# Patient Record
Sex: Female | Born: 1970 | Race: White | Hispanic: No | Marital: Married | Smoking: Never smoker
Health system: Southern US, Community
[De-identification: ages and names within clinical notes are randomized; demographics above are authoritative.]

## PROBLEM LIST (undated history)

## (undated) DIAGNOSIS — I351 Nonrheumatic aortic (valve) insufficiency: Secondary | ICD-10-CM

## (undated) DIAGNOSIS — D649 Anemia, unspecified: Secondary | ICD-10-CM

## (undated) DIAGNOSIS — K645 Perianal venous thrombosis: Secondary | ICD-10-CM

## (undated) DIAGNOSIS — N879 Dysplasia of cervix uteri, unspecified: Secondary | ICD-10-CM

## (undated) DIAGNOSIS — R011 Cardiac murmur, unspecified: Secondary | ICD-10-CM

## (undated) HISTORY — PX: OTHER SURGICAL HISTORY: SHX169

## (undated) HISTORY — PX: ABDOMINAL HYSTERECTOMY: SHX81

## (undated) HISTORY — PX: SINUS EXPLORATION: SHX5214

---

## 2005-10-08 ENCOUNTER — Ambulatory Visit: Payer: Self-pay

## 2007-11-02 ENCOUNTER — Observation Stay: Payer: Self-pay | Admitting: Certified Nurse Midwife

## 2008-01-05 HISTORY — PX: OTHER SURGICAL HISTORY: SHX169

## 2008-03-22 ENCOUNTER — Inpatient Hospital Stay: Payer: Self-pay

## 2008-03-29 ENCOUNTER — Ambulatory Visit: Payer: Self-pay | Admitting: Pediatrics

## 2008-05-11 DIAGNOSIS — D239 Other benign neoplasm of skin, unspecified: Secondary | ICD-10-CM

## 2008-05-11 HISTORY — DX: Other benign neoplasm of skin, unspecified: D23.9

## 2010-08-14 HISTORY — PX: LEEP: SHX91

## 2011-08-29 ENCOUNTER — Ambulatory Visit: Payer: Self-pay | Admitting: Obstetrics and Gynecology

## 2014-03-24 ENCOUNTER — Ambulatory Visit: Payer: Self-pay | Admitting: General Practice

## 2014-07-14 ENCOUNTER — Ambulatory Visit: Payer: Self-pay | Admitting: Obstetrics and Gynecology

## 2015-03-28 ENCOUNTER — Emergency Department
Admission: EM | Admit: 2015-03-28 | Discharge: 2015-03-28 | Disposition: A | Discharge status: Home / Self Care | Payer: 59 | Attending: Emergency Medicine | Admitting: Emergency Medicine

## 2015-03-28 ENCOUNTER — Emergency Department: Payer: 59

## 2015-03-28 ENCOUNTER — Encounter: Payer: Self-pay | Admitting: Emergency Medicine

## 2015-03-28 DIAGNOSIS — Z79899 Other long term (current) drug therapy: Secondary | ICD-10-CM | POA: Insufficient documentation

## 2015-03-28 DIAGNOSIS — R109 Unspecified abdominal pain: Secondary | ICD-10-CM

## 2015-03-28 DIAGNOSIS — Z3202 Encounter for pregnancy test, result negative: Secondary | ICD-10-CM | POA: Diagnosis not present

## 2015-03-28 DIAGNOSIS — Z7951 Long term (current) use of inhaled steroids: Secondary | ICD-10-CM | POA: Diagnosis not present

## 2015-03-28 DIAGNOSIS — M545 Low back pain: Secondary | ICD-10-CM | POA: Diagnosis not present

## 2015-03-28 DIAGNOSIS — R1031 Right lower quadrant pain: Secondary | ICD-10-CM | POA: Diagnosis present

## 2015-03-28 HISTORY — DX: Cardiac murmur, unspecified: R01.1

## 2015-03-28 LAB — WET PREP, GENITAL
CLUE CELLS WET PREP: NONE SEEN
Trich, Wet Prep: NONE SEEN
Yeast Wet Prep HPF POC: NONE SEEN

## 2015-03-28 LAB — BASIC METABOLIC PANEL
Anion gap: 8 (ref 5–15)
BUN: 11 mg/dL (ref 6–20)
CO2: 24 mmol/L (ref 22–32)
Calcium: 8.6 mg/dL — ABNORMAL LOW (ref 8.9–10.3)
Chloride: 108 mmol/L (ref 101–111)
Creatinine, Ser: 0.71 mg/dL (ref 0.44–1.00)
GFR calc non Af Amer: >60 mL/min (ref >60)
GLUCOSE: 98 mg/dL (ref 65–99)
POTASSIUM: 3.2 mmol/L — AB (ref 3.5–5.1)
SODIUM: 140 mmol/L (ref 135–145)

## 2015-03-28 LAB — CBC
HEMATOCRIT: 35.7 % (ref 35.0–47.0)
Hemoglobin: 11.6 g/dL — ABNORMAL LOW (ref 12.0–16.0)
MCH: 25.3 pg — ABNORMAL LOW (ref 26.0–34.0)
MCHC: 32.4 g/dL (ref 32.0–36.0)
MCV: 78.2 fL — AB (ref 80.0–100.0)
PLATELETS: 324 10*3/uL (ref 150–440)
RBC: 4.57 MIL/uL (ref 3.80–5.20)
RDW: 14.5 % (ref 11.5–14.5)
WBC: 11.3 10*3/uL — AB (ref 3.6–11.0)

## 2015-03-28 LAB — URINALYSIS COMPLETE WITH MICROSCOPIC (ARMC ONLY)
Bilirubin Urine: NEGATIVE
Glucose, UA: NEGATIVE mg/dL
Hgb urine dipstick: NEGATIVE
Ketones, ur: NEGATIVE mg/dL
NITRITE: NEGATIVE
Protein, ur: NEGATIVE mg/dL
Specific Gravity, Urine: 1.008 (ref 1.005–1.030)
pH: 9 — ABNORMAL HIGH (ref 5.0–8.0)

## 2015-03-28 LAB — CHLAMYDIA/NGC RT PCR (ARMC ONLY)
Chlamydia Tr: NOT DETECTED
N gonorrhoeae: NOT DETECTED

## 2015-03-28 LAB — PREGNANCY, URINE: Preg Test, Ur: NEGATIVE

## 2015-03-28 LAB — POCT PREGNANCY, URINE: Preg Test, Ur: NEGATIVE

## 2015-03-28 MED ORDER — SODIUM CHLORIDE 0.9 % IV BOLUS (SEPSIS)
1000.0000 mL | Freq: Once | INTRAVENOUS | Status: AC
  Administered 2015-03-28: 1000 mL via INTRAVENOUS

## 2015-03-28 MED ORDER — MORPHINE SULFATE 4 MG/ML IJ SOLN
4.0000 mg | Freq: Once | INTRAMUSCULAR | Status: AC
  Administered 2015-03-28: 4 mg via INTRAVENOUS

## 2015-03-28 MED ORDER — OXYCODONE-ACETAMINOPHEN 5-325 MG PO TABS: 1.0000 | ORAL_TABLET | Freq: Four times a day (QID) | ORAL | Status: DC | PRN

## 2015-03-28 MED ORDER — ONDANSETRON HCL 4 MG/2ML IJ SOLN
4.0000 mg | Freq: Once | INTRAMUSCULAR | Status: AC
Start: 2015-03-28 — End: 2015-03-28
  Administered 2015-03-28: 4 mg via INTRAVENOUS

## 2015-03-28 MED ORDER — MORPHINE SULFATE 4 MG/ML IJ SOLN
INTRAMUSCULAR | Status: AC
  Administered 2015-03-28: 4 mg via INTRAVENOUS
  Filled 2015-03-28: qty 1

## 2015-03-28 MED ORDER — ONDANSETRON HCL 4 MG/2ML IJ SOLN
INTRAMUSCULAR | Status: AC
  Administered 2015-03-28: 4 mg via INTRAVENOUS
  Filled 2015-03-28: qty 2

## 2015-03-28 NOTE — ED Notes (Signed)
Patient ambulatory to triage with steady gait, without difficulty, appears uncomfortable, tearful; pt reports since 530pm having right side/flank pain radiating into right lower abd (took 2 advil 1130pm) accomp by nausea; denies hx of same

## 2015-03-28 NOTE — ED Provider Notes (Signed)
[facility] Emergency Department Provider Note  ____________________________________________  Time seen: Approximately 341 AM  I have reviewed the triage vital signs and the nursing notes.   HISTORY  Chief Complaint Flank Pain    HPI Mio Schellinger is a 44 y.o. female who comes in today with right sided lower back pain that started at 1730. The patient reports that it worsened and wrapped around to her right lower quadrant. The patient reports that she's tried ice heat and Advil but the pain never improved. She reports that the pain simply worsened and worsened until she decided to come in for evaluation. The patient reports that she's never had this pain before and became nauseous when she arrived to the hospital. The patient reports currently after some medicine her pain as a 4 out of 10 in intensity. The patient also reports that she feels gassy currently. She denies pain with urination or hematuria. She is unsure what causing the pain but reports that she is unable to tolerate it anymore.   Past Medical History  Diagnosis Date  . Heart murmur     There are no active problems to display for this patient.   History reviewed. No pertinent past surgical history.  Current Outpatient Rx  Name  Route  Sig  Dispense  Refill  . cetirizine (ZYRTEC) 10 MG tablet   Oral   Take 10 mg by mouth daily.         Marland Kitchen ibuprofen (ADVIL,MOTRIN) 200 MG tablet   Oral   Take 200 mg by mouth every 6 (six) hours as needed.         . montelukast (SINGULAIR) 10 MG tablet   Oral   Take 10 mg by mouth at bedtime.         . Beclomethasone Dipropionate (QNASL CHILDRENS) 40 MCG/ACT AERS   Each Nare   Place 2 sprays into both nostrils 2 (two) times daily.         Marland Kitchen oxyCODONE-acetaminophen (ROXICET) 5-325 MG per tablet   Oral   Take 1 tablet by mouth every 6 (six) hours as needed.   12 tablet   0     Allergies Sulfa antibiotics  No family history on  file.  Social History History  Substance Use Topics  . Smoking status: Never Smoker   . Smokeless tobacco: Not on file  . Alcohol Use: No    Review of Systems Constitutional: No fever/chills Eyes: No visual changes. ENT: No sore throat. Cardiovascular: Denies chest pain. Respiratory: Denies shortness of breath. Gastrointestinal: Abdominal pain Genitourinary: Negative for dysuria. Musculoskeletal: right sided back pain. Skin: Negative for rash. Neurological: Negative for headaches, focal weakness or numbness.  10-point ROS otherwise negative.  ____________________________________________   PHYSICAL EXAM:  VITAL SIGNS: ED Triage Vitals  Enc Vitals Group     BP 03/28/15 0248 126/75 mmHg     Pulse Rate 03/28/15 0248 93     Resp 03/28/15 0248 22     Temp 03/28/15 0248 98 F (36.7 C)     Temp Source 03/28/15 0248 Oral     SpO2 03/28/15 0248 100 %     Weight 03/28/15 0248 162 lb (73.483 kg)     Height 03/28/15 0248 5\' 4"  (1.626 m)     Head Cir --      Peak Flow --      Pain Score 03/28/15 0249 8     Pain Loc --      Pain Edu? --  Excl. in [place]? --     Constitutional: Alert and oriented. Well appearing and in moderate distress. Eyes: Conjunctivae are normal. PERRL. EOMI. Head: Atraumatic. Nose: No congestion/rhinnorhea. Mouth/Throat: Mucous membranes are moist.  Oropharynx non-erythematous. Cardiovascular: Normal rate, regular rhythm. Grossly normal heart sounds.   Respiratory: Normal respiratory effort.  No retractions. Lungs CTAB. Gastrointestinal: Soft right lower quadrant tenderness to palpation, mild right CVA tenderness Genitourinary: deferred Musculoskeletal: No lower extremity tenderness nor edema.  No joint effusions. Neurologic:  Normal speech and language. No gross focal neurologic deficits are appreciated.  Skin:  Skin is warm, dry and intact. No rash noted. Psychiatric: Mood and affect are normal.   ____________________________________________    LABS (all labs ordered are listed, but only abnormal results are displayed)  Labs Reviewed  BASIC METABOLIC PANEL - Abnormal; Notable for the following:    Potassium 3.2 (*)    Calcium 8.6 (*)    All other components within normal limits  CBC - Abnormal; Notable for the following:    WBC 11.3 (*)    Hemoglobin 11.6 (*)    MCV 78.2 (*)    MCH 25.3 (*)    All other components within normal limits  URINALYSIS COMPLETEWITH MICROSCOPIC ([facility] ONLY) - Abnormal; Notable for the following:    Color, Urine STRAW (*)    APPearance CLEAR (*)    pH 9.0 (*)    Leukocytes, UA TRACE (*)    Bacteria, UA RARE (*)    Squamous Epithelial / LPF 0-5 (*)    All other components within normal limits  WET PREP, GENITAL  CHLAMYDIA/NGC RT PCR ([facility] ONLY)  PREGNANCY, URINE  POCT PREGNANCY, URINE   ____________________________________________  EKG  none ____________________________________________  RADIOLOGY  CT Renal Stone protocol: No acute findings are evident in the abdomen or pelvis, negative for urinary calculus, hydronephrosis or ureteral dilation, normal appendix, small fat-containing umbilical hernia ____________________________________________   PROCEDURES  Procedure(s) performed: None  Critical Care performed: No  ____________________________________________   INITIAL IMPRESSION / ASSESSMENT AND PLAN / ED COURSE  Pertinent labs & imaging results that were available during my care of the patient were reviewed by me and considered in my medical decision making (see chart for details).  This is a 44 year old female who comes in with acute onset of right flank pain with radiation to her right lower quadrant. The patient received some morphine and zofran for pain as well as some fluids. We will await the results of the patient's CT scan and reasses the patient  ----------------------------------------- 7:45 AM on  03/28/2015 -----------------------------------------  The patient's pain is improved but at this time we are unsure what is causing the patient's pain. I did perform a pelvic ultrasound and the patient did not have any adnexal masses or tenderness palpation. We will await the results of the patient's wet prep and disposition the patient at that point. I will give the patient some medicine for home and have her follow-up with her doctors. The patient's care was signed out to Dr. Thomasene Lot will follow-up the results of the wet prep. ____________________________________________   FINAL CLINICAL IMPRESSION(S) / ED DIAGNOSES  Final diagnoses:  Right flank pain  Right lower quadrant abdominal pain      Loney Hering, MD 03/28/15 [phone]

## 2015-03-28 NOTE — ED Notes (Signed)
MD at bedside. 

## 2015-03-28 NOTE — ED Notes (Signed)
Patient transported to CT 

## 2015-03-28 NOTE — ED Notes (Signed)
Patient present to ED with complaint of right flank pain radiating to RLQ abdominal pain since 530 pm last evening. Patient reports feeling nauseas, bloating, and "burping a lot." Patient denies any urinary problems, diarrhea, chest pain, shortness of breath, or fevers at home. Patient appears in discomfort, unable to get into comfortable position. Patient alert and oriented, respirations even and unlabored. Family at bedside, call bell within reach.

## 2015-03-28 NOTE — ED Provider Notes (Signed)
-----------------------------------------   8:41 AM on 03/28/2015 -----------------------------------------  I have just seen this patient, interviewed her and reexamined her, after the patient had been signed out to me by Dr. Dahlia Client. See Dr. Pollyann Samples history and physical for the detail of this patient's care through the past night in the emergency department.  In summary, the patient came in with severe right flank pain that shifted towards her right lower quadrant. Apparently she had the appearance of renal colic. She had a negative CT scan of her abdomen and pelvis and normal urine on exam.  Dr. Dahlia Client had performed a pelvic exam which was benign. A wet prep was pending.  Wet prep has returned with white blood cells but no clue cells.  On reassessment, the patient appears quite comfortable. She says she feels fine. She is not having any pain.   We have discussed the wet prep findings. She has not had any vaginal discharge or other vaginal complaint. We have agreed not to use any medication due to the limited finding of white blood cells on the wet prep.  Dr. Dahlia Client has prescribed Roxicet, dispensed 12, in case her pain returns. Laura Schmitt and I have spoken about the possibility of pain returning and take the Roxicet as well as ibuprofen, but if pain is not controlled, to return to the emergency department.  Ahmed Prima, MD 03/28/15 [phone]

## 2015-03-28 NOTE — Discharge Instructions (Signed)
Flank Pain Flank pain refers to pain that is located on the side of the body between the upper abdomen and the back. The pain may occur over a short period of time (acute) or may be long-term or reoccurring (chronic). It may be mild or severe. Flank pain can be caused by many things. CAUSES  Some of the more common causes of flank pain include:  Muscle strains.   Muscle spasms.   A disease of your spine (vertebral disk disease).   A lung infection (pneumonia).   Fluid around your lungs (pulmonary edema).   A kidney infection.   Kidney stones.   A very painful skin rash caused by the chickenpox virus (shingles).   Gallbladder disease.  [place] care will depend on the cause of your pain. In general,  Rest as directed by your caregiver.  Drink enough fluids to keep your urine clear or pale yellow.  Only take over-the-counter or prescription medicines as directed by your caregiver. Some medicines may help relieve the pain.  Tell your caregiver about any changes in your pain.  Follow up with your caregiver as directed. SEEK IMMEDIATE MEDICAL CARE IF:   Your pain is not controlled with medicine.   You have new or worsening symptoms.  Your pain increases.   You have abdominal pain.   You have shortness of breath.   You have persistent nausea or vomiting.   You have swelling in your abdomen.   You feel faint or pass out.   You have blood in your urine.  You have a fever or persistent symptoms for more than 2-3 days.  You have a fever and your symptoms suddenly get worse. MAKE SURE YOU:   Understand these instructions.  Will watch your condition.  Will get help right away if you are not doing well or get worse. Document Released: 11/07/2005 Document Revised: 06/10/2012 Document Reviewed: 04/30/2012 [facility] Patient Information 2015 [place], [place]. This information is not intended to replace advice given to you by your  health care provider. Make sure you discuss any questions you have with your health care provider.  Abdominal Pain, Women Abdominal (stomach, pelvic, or belly) pain can be caused by many things. It is important to tell your doctor:  The location of the pain.  Does it come and go or is it present all the time?  Are there things that start the pain (eating certain foods, exercise)?  Are there other symptoms associated with the pain (fever, nausea, vomiting, diarrhea)? All of this is helpful to know when trying to find the cause of the pain. CAUSES   Stomach: virus or bacteria infection, or ulcer.  Intestine: appendicitis (inflamed appendix), regional ileitis (Crohn's disease), ulcerative colitis (inflamed colon), irritable bowel syndrome, diverticulitis (inflamed diverticulum of the colon), or cancer of the stomach or intestine.  Gallbladder disease or stones in the gallbladder.  Kidney disease, kidney stones, or infection.  Pancreas infection or cancer.  Fibromyalgia (pain disorder).  Diseases of the female organs:  Uterus: fibroid (non-cancerous) tumors or infection.  Fallopian tubes: infection or tubal pregnancy.  Ovary: cysts or tumors.  Pelvic adhesions (scar tissue).  Endometriosis (uterus lining tissue growing in the pelvis and on the pelvic organs).  Pelvic congestion syndrome (female organs filling up with blood just before the menstrual period).  Pain with the menstrual period.  Pain with ovulation (producing an egg).  Pain with an IUD (intrauterine device, birth control) in the uterus.  Cancer of the female  organs.  Functional pain (pain not caused by a disease, may improve without treatment).  Psychological pain.  Depression. DIAGNOSIS  Your doctor will decide the seriousness of your pain by doing an examination.  Blood tests.  X-rays.  Ultrasound.  CT scan (computed tomography, special type of X-ray).  MRI (magnetic resonance  imaging).  Cultures, for infection.  Barium enema (dye inserted in the large intestine, to better view it with X-rays).  Colonoscopy (looking in intestine with a lighted tube).  Laparoscopy (minor surgery, looking in abdomen with a lighted tube).  Major abdominal exploratory surgery (looking in abdomen with a large incision). TREATMENT  The treatment will depend on the cause of the pain.   Many cases can be observed and treated at home.  Over-the-counter medicines recommended by your caregiver.  Prescription medicine.  Antibiotics, for infection.  Birth control pills, for painful periods or for ovulation pain.  Hormone treatment, for endometriosis.  Nerve blocking injections.  Physical therapy.  Antidepressants.  Counseling with a psychologist or psychiatrist.  Minor or major surgery. HOME CARE INSTRUCTIONS   Do not take laxatives, unless directed by your caregiver.  Take over-the-counter pain medicine only if ordered by your caregiver. Do not take aspirin because it can cause an upset stomach or bleeding.  Try a clear liquid diet (broth or water) as ordered by your caregiver. Slowly move to a bland diet, as tolerated, if the pain is related to the stomach or intestine.  Have a thermometer and take your temperature several times a day, and record it.  Bed rest and sleep, if it helps the pain.  Avoid sexual intercourse, if it causes pain.  Avoid stressful situations.  Keep your follow-up appointments and tests, as your caregiver orders.  If the pain does not go away with medicine or surgery, you may try:  Acupuncture.  Relaxation exercises (yoga, meditation).  Group therapy.  Counseling. SEEK MEDICAL CARE IF:   You notice certain foods cause stomach pain.  Your home care treatment is not helping your pain.  You need stronger pain medicine.  You want your IUD removed.  You feel faint or lightheaded.  You develop nausea and vomiting.  You  develop a rash.  You are having side effects or an allergy to your medicine. SEEK IMMEDIATE MEDICAL CARE IF:   Your pain does not go away or gets worse.  You have a fever.  Your pain is felt only in portions of the abdomen. The right side could possibly be appendicitis. The left lower portion of the abdomen could be colitis or diverticulitis.  You are passing blood in your stools (bright red or black tarry stools, with or without vomiting).  You have blood in your urine.  You develop chills, with or without a fever.  You pass out. MAKE SURE YOU:   Understand these instructions.  Will watch your condition.  Will get help right away if you are not doing well or get worse. Document Released: 07/14/2007 Document Revised: 01/31/2014 Document Reviewed: 08/03/2009 [facility] Patient Information 2015 [place], [place]. This information is not intended to replace advice given to you by your health care provider. Make sure you discuss any questions you have with your health care provider.

## 2015-08-11 ENCOUNTER — Ambulatory Visit: Payer: Self-pay | Admitting: Physician Assistant

## 2015-08-11 ENCOUNTER — Encounter: Payer: Self-pay | Admitting: Physician Assistant

## 2015-08-11 VITALS — BP 110/60 | HR 72 | Temp 98.4°F

## 2015-08-11 DIAGNOSIS — H1030 Unspecified acute conjunctivitis, unspecified eye: Secondary | ICD-10-CM

## 2015-08-11 DIAGNOSIS — J3089 Other allergic rhinitis: Secondary | ICD-10-CM

## 2015-08-11 MED ORDER — TOBRAMYCIN 0.3 % OP SOLN: 2.0000 [drp] | OPHTHALMIC | Status: DC

## 2015-08-11 MED ORDER — BECLOMETHASONE DIPROPIONATE 40 MCG/ACT NA AERS: 2.0000 | INHALATION_SPRAY | Freq: Two times a day (BID) | NASAL | Status: DC

## 2015-08-11 NOTE — Progress Notes (Signed)
S: c/o r eye being red and sticky, some matting in both eyes this am, no fever/chills, no congestion, sx started last night at work , also ?if can have refill on q nasal  O: vitals wnl, nad, perrl eomi, conjunctiva injected b/l, lungs c t a, cv rrr  A: Acute conjunctivitis  P: tobramycin opth gtts, qnasal refill

## 2015-09-11 ENCOUNTER — Other Ambulatory Visit: Payer: Self-pay | Admitting: Physician Assistant

## 2015-09-19 ENCOUNTER — Telehealth: Payer: Self-pay | Admitting: Physician Assistant

## 2015-09-19 MED ORDER — MONTELUKAST SODIUM 10 MG PO TABS: 10.0000 mg | ORAL_TABLET | Freq: Every day | ORAL | Status: DC

## 2015-09-19 NOTE — Telephone Encounter (Signed)
One refill approved, pt was written this rx over a year ago in the clinic, I was under the impression she has a pcp, pt will need to be seen in the clinic for additional refills

## 2015-09-19 NOTE — Telephone Encounter (Signed)
Left message in regards to patient scheduling appointment or seeing her PCP for  additional refills on Singulair.

## 2015-11-01 ENCOUNTER — Ambulatory Visit: Payer: Self-pay | Admitting: Physician Assistant

## 2015-11-01 ENCOUNTER — Encounter: Payer: Self-pay | Admitting: Physician Assistant

## 2015-11-01 VITALS — BP 120/60 | HR 76 | Temp 98.3°F

## 2015-11-01 DIAGNOSIS — H6122 Impacted cerumen, left ear: Secondary | ICD-10-CM

## 2015-11-01 MED ORDER — MONTELUKAST SODIUM 10 MG PO TABS
10.0000 mg | ORAL_TABLET | Freq: Every day | ORAL | Status: DC
Start: 2015-11-01 — End: 2021-03-29

## 2015-11-01 NOTE — Progress Notes (Signed)
S: c/o left ear itching, hx of surgery on same ear years ago to remove cyst, gets buildup in ear where it looks like mashed potatoes, no fever/chills, states it feels like its draining, was told to use mix of etoh and cider vinegar, not using this because it makes her dizzy if its not the right temp, also ?if can have refill on singulair  O: vitals wnl, nad, left tm dull, unable to see landmarks, +lumpy soft wax at superior aspect, white edging to wax, no redness or swelling, neck supple no lymph  A: cerumen, itching in ear  P: f/u with ENT, try using mix that ENT recommended, explained to patient that we can give 3 refills but she needs to get remainder from her pcp

## 2015-12-13 ENCOUNTER — Other Ambulatory Visit: Payer: Self-pay | Admitting: Obstetrics and Gynecology

## 2015-12-13 ENCOUNTER — Ambulatory Visit
Admission: RE | Admit: 2015-12-13 | Discharge: 2015-12-13 | Disposition: A | Discharge status: Home / Self Care | Payer: 59 | Source: Ambulatory Visit | Attending: Obstetrics and Gynecology | Admitting: Obstetrics and Gynecology

## 2015-12-13 DIAGNOSIS — Z1231 Encounter for screening mammogram for malignant neoplasm of breast: Secondary | ICD-10-CM | POA: Insufficient documentation

## 2015-12-13 DIAGNOSIS — Z8349 Family history of other endocrine, nutritional and metabolic diseases: Secondary | ICD-10-CM | POA: Diagnosis not present

## 2015-12-13 DIAGNOSIS — F526 Dyspareunia not due to a substance or known physiological condition: Secondary | ICD-10-CM | POA: Diagnosis not present

## 2015-12-13 DIAGNOSIS — Z124 Encounter for screening for malignant neoplasm of cervix: Secondary | ICD-10-CM | POA: Diagnosis not present

## 2015-12-13 DIAGNOSIS — Z01419 Encounter for gynecological examination (general) (routine) without abnormal findings: Secondary | ICD-10-CM | POA: Diagnosis not present

## 2015-12-13 DIAGNOSIS — Z1211 Encounter for screening for malignant neoplasm of colon: Secondary | ICD-10-CM | POA: Diagnosis not present

## 2015-12-14 DIAGNOSIS — Z8349 Family history of other endocrine, nutritional and metabolic diseases: Secondary | ICD-10-CM | POA: Diagnosis not present

## 2015-12-14 DIAGNOSIS — Z01419 Encounter for gynecological examination (general) (routine) without abnormal findings: Secondary | ICD-10-CM | POA: Diagnosis not present

## 2015-12-26 DIAGNOSIS — D251 Intramural leiomyoma of uterus: Secondary | ICD-10-CM | POA: Diagnosis not present

## 2015-12-26 DIAGNOSIS — N941 Unspecified dyspareunia: Secondary | ICD-10-CM | POA: Diagnosis not present

## 2015-12-29 DIAGNOSIS — N941 Unspecified dyspareunia: Secondary | ICD-10-CM | POA: Diagnosis not present

## 2015-12-29 DIAGNOSIS — D251 Intramural leiomyoma of uterus: Secondary | ICD-10-CM | POA: Diagnosis not present

## 2015-12-29 DIAGNOSIS — I351 Nonrheumatic aortic (valve) insufficiency: Secondary | ICD-10-CM | POA: Diagnosis not present

## 2016-01-02 ENCOUNTER — Ambulatory Visit: Payer: Self-pay | Admitting: Physician Assistant

## 2016-01-02 ENCOUNTER — Encounter: Payer: Self-pay | Admitting: Physician Assistant

## 2016-01-02 VITALS — BP 120/60 | HR 80 | Temp 98.7°F

## 2016-01-02 DIAGNOSIS — J018 Other acute sinusitis: Secondary | ICD-10-CM

## 2016-01-02 MED ORDER — AMOXICILLIN 875 MG PO TABS: 875.0000 mg | ORAL_TABLET | Freq: Two times a day (BID) | ORAL | Status: DC

## 2016-01-02 NOTE — Progress Notes (Signed)
S: C/o runny nose and congestion for 3 days, no fever, chills, body aches, cp/sob, v/d; mucus is green and thick, cough is sporadic, c/o of facial and dental pain.   Using otc meds: mucinex  O: PE: vitals wnl, nad, perrl eomi, normocephalic, tms dull, nasal mucosa red and swollen, throat injected, neck supple no lymph, lungs c t a, cv rrr, neuro intact  A:  Acute sinusitis   P: amoxil 875mg  bid, drink fluids, continue regular meds , use otc meds of choice, return if not improving in 5 days, return earlier if worsening

## 2016-02-15 DIAGNOSIS — Z01818 Encounter for other preprocedural examination: Secondary | ICD-10-CM | POA: Diagnosis not present

## 2016-02-15 DIAGNOSIS — R0602 Shortness of breath: Secondary | ICD-10-CM | POA: Diagnosis not present

## 2016-02-15 DIAGNOSIS — I351 Nonrheumatic aortic (valve) insufficiency: Secondary | ICD-10-CM | POA: Diagnosis not present

## 2016-02-19 ENCOUNTER — Other Ambulatory Visit: Payer: Self-pay | Admitting: Nurse Practitioner

## 2016-02-19 DIAGNOSIS — Z01818 Encounter for other preprocedural examination: Secondary | ICD-10-CM

## 2016-02-21 ENCOUNTER — Other Ambulatory Visit: Payer: Self-pay | Admitting: Cardiology

## 2016-02-21 ENCOUNTER — Ambulatory Visit
Admission: RE | Admit: 2016-02-21 | Discharge: 2016-02-21 | Disposition: A | Discharge status: Home / Self Care | Payer: 59 | Source: Ambulatory Visit | Attending: Internal Medicine | Admitting: Internal Medicine

## 2016-02-21 ENCOUNTER — Ambulatory Visit: Admission: RE | Admit: 2016-02-21 | Payer: 59 | Source: Ambulatory Visit

## 2016-02-21 ENCOUNTER — Ambulatory Visit
Admission: RE | Admit: 2016-02-21 | Discharge: 2016-02-21 | Disposition: A | Discharge status: Home / Self Care | Payer: 59 | Source: Ambulatory Visit | Attending: Nurse Practitioner | Admitting: Nurse Practitioner

## 2016-02-21 DIAGNOSIS — I351 Nonrheumatic aortic (valve) insufficiency: Secondary | ICD-10-CM | POA: Diagnosis not present

## 2016-02-21 DIAGNOSIS — Z01818 Encounter for other preprocedural examination: Secondary | ICD-10-CM

## 2016-02-21 DIAGNOSIS — R0600 Dyspnea, unspecified: Secondary | ICD-10-CM | POA: Insufficient documentation

## 2016-02-21 DIAGNOSIS — Z01810 Encounter for preprocedural cardiovascular examination: Secondary | ICD-10-CM

## 2016-02-21 DIAGNOSIS — I071 Rheumatic tricuspid insufficiency: Secondary | ICD-10-CM | POA: Diagnosis not present

## 2016-02-21 DIAGNOSIS — R079 Chest pain, unspecified: Secondary | ICD-10-CM | POA: Diagnosis not present

## 2016-02-21 LAB — EXERCISE TOLERANCE TEST
CHL CUP RESTING HR STRESS: 75 {beats}/min
CSEPED: 6 min
Estimated workload: 7.8 METS
Exercise duration (sec): 32 s
Peak HR: 169 {beats}/min

## 2016-02-21 NOTE — Progress Notes (Signed)
*  PRELIMINARY RESULTS* Echocardiogram 2D Echocardiogram has been performed.  Laura Schmitt 02/21/2016, 11:38 AM

## 2016-02-29 DIAGNOSIS — R0602 Shortness of breath: Secondary | ICD-10-CM | POA: Diagnosis not present

## 2016-02-29 DIAGNOSIS — I351 Nonrheumatic aortic (valve) insufficiency: Secondary | ICD-10-CM | POA: Diagnosis not present

## 2016-03-04 NOTE — H&P (Signed)
Laura Schmitt is a 45 y.o. female here [facility] and bilateral salpingectomy  G2P2 svdx2 .pt not seen in 2 yrs . C/o dyspareunia for the past 2 yrs . Not insertional and feels like her partner is hitting something . She stays sore for 1-2 days after . Same partner for 5 yrs . Starting to cause problems between the 2 . No h/o pelvic pain .  periclitoral cyst resolve spontaneously after I saw her last . IUD removed without any change in pain. Recent u/s 3 cm poster fibroid near cx .   Past Medical History:  has a past medical history of History of anemia; History of cervical dysplasia; Moderate aortic insufficiency; Other nonspecific abnormal finding (07/29/2011); and Thrombosed external hemorrhoid.  Past Surgical History:  has a past surgical history that includes I&D of thrombosed hemorrhoid (01/05/2008); LEEP (08/14/2010); and Ear cyst removed (Left, 1992). Family History: family history includes Diabetes mellitus in her mother. Social History:  reports that she has never smoked. She does not have any smokeless tobacco history on file. She reports that she drinks alcohol. OB/GYN History:  OB History    Gravida Para Term Preterm AB TAB SAB Ectopic Multiple Living   2 2 2       2       Allergies: is allergic to sulfa (sulfonamide antibiotics). Medications:  Current Outpatient Prescriptions:  .  beclomethasone dipropionate (QNASL) 40 mcg/actuation HFAA, 2 sprays by Nasal route once daily., Disp: , Rfl:  .  cetirizine (ZYRTEC) 10 mg capsule, Take 10 mg by mouth once daily., Disp: , Rfl:  .  oxyCODONE-acetaminophen (PERCOCET) 5-325 mg tablet, Take by mouth., Disp: , Rfl:   Review of Systems: General:                                          No fatigue or weight loss Eyes:                                                         No vision changes Ears:                                                          No hearing difficulty Respiratory:                No cough or shortness of  breath Pulmonary:                                      No asthma or shortness of breath Cardiovascular:                     No chest pain, palpitations, dyspnea on exertion Gastrointestinal:                    No abdominal bloating, chronic diarrhea, constipations, masses, pain or hematochezia Genitourinary:  No hematuria, dysuria, abnormal vaginal discharge, pelvic pain, Menometrorrhagia Lymphatic:                                       No swollen lymph nodes Musculoskeletal:                   No muscle weakness Neurologic:                                      No extremity weakness, syncope, seizure disorder Psychiatric:                                      No history of depression, delusions or suicidal/homicidal ideation    Exam:      Vitals:   12/13/15 1049  BP: 120/69  Pulse: 93    Body mass index is 30.65 kg/(m^2).  WDWN white/  female in NAD   Lungs: CTA  CV : RRR without murmur   Breast: exam done in sitting and lying position : No dimpling or retraction, no dominant mass, no spontaneous discharge, no axillary adenopathy Neck:  no thyromegaly Abdomen: soft , no mass, normal active bowel sounds,  non-tender, no rebound tenderness Pelvic: tanner stage 5 ,  External genitalia: vulva /labia no lesions Urethra: no prolapse Vagina: normal physiologic d/c Cervix: no lesions, bulbous anterior and tender with deep insertion of the speculum .  Uterus: retroverted tender with palpation  Adnexa: no mass,  non-tender   Rectovaginal: no mass + hemorrhoid anterior   Impression:   Persistent Dyspareunia   Plan:    [facility] and bilateral salpingectomy  Risks of the procedure explained .                                                                                   Standing Expiration Date:          Standing Status:                     03/12/2016  .             .             .                      Caroline Sauger, MD       Electronically signed by Caroline Sauger, MD at 12/15/2015 9:48 AM      Office Visit on 12/13/2015      Department  Name Address Phone Fax  [facility] [place] [address] [phone]  Service Location  Name Address      [address] [place] [address]

## 2016-03-05 ENCOUNTER — Encounter
Admission: RE | Admit: 2016-03-05 | Discharge: 2016-03-05 | Disposition: A | Discharge status: Home / Self Care | Payer: 59 | Source: Ambulatory Visit | Attending: Obstetrics and Gynecology | Admitting: Obstetrics and Gynecology

## 2016-03-05 DIAGNOSIS — Z01812 Encounter for preprocedural laboratory examination: Secondary | ICD-10-CM | POA: Diagnosis not present

## 2016-03-05 LAB — CBC
HEMATOCRIT: 31.3 % — AB (ref 35.0–47.0)
HEMOGLOBIN: 10.4 g/dL — AB (ref 12.0–16.0)
MCH: 24.7 pg — AB (ref 26.0–34.0)
MCHC: 33.3 g/dL (ref 32.0–36.0)
MCV: 74.3 fL — AB (ref 80.0–100.0)
Platelets: 335 10*3/uL (ref 150–440)
RBC: 4.22 MIL/uL (ref 3.80–5.20)
RDW: 15 % — ABNORMAL HIGH (ref 11.5–14.5)
WBC: 10.2 10*3/uL (ref 3.6–11.0)

## 2016-03-05 LAB — TYPE AND SCREEN
ABO/RH(D): A NEG
Antibody Screen: NEGATIVE

## 2016-03-05 LAB — BASIC METABOLIC PANEL
ANION GAP: 7 (ref 5–15)
BUN: 13 mg/dL (ref 6–20)
CHLORIDE: 106 mmol/L (ref 101–111)
CO2: 26 mmol/L (ref 22–32)
Calcium: 8.6 mg/dL — ABNORMAL LOW (ref 8.9–10.3)
Creatinine, Ser: 0.62 mg/dL (ref 0.44–1.00)
GFR calc non Af Amer: >60 mL/min (ref >60)
Glucose, Bld: 93 mg/dL (ref 65–99)
POTASSIUM: 3.4 mmol/L — AB (ref 3.5–5.1)
Sodium: 139 mmol/L (ref 135–145)

## 2016-03-05 NOTE — Patient Instructions (Signed)
  Your procedure is scheduled on: 03/18/16 Mon Report to Same Day Surgery 2nd floor medical mall To find out your arrival time please call [phone] between 1PM - 3PM on 03/15/16 Fri  Remember: Instructions that are not followed completely may result in serious medical risk, up to and including death, or upon the discretion of your surgeon and anesthesiologist your surgery may need to be rescheduled.    _x___ 1. Do not eat food or drink liquids after midnight. No gum chewing or hard candies.     __x__ 2. No Alcohol for 24 hours before or after surgery.   ____ 3. Bring all medications with you on the day of surgery if instructed.    __x__ 4. Notify your doctor if there is any change in your medical condition     (cold, fever, infections).     Do not wear jewelry, make-up, hairpins, clips or nail polish.  Do not wear lotions, powders, or perfumes. You may wear deodorant.  Do not shave 48 hours prior to surgery. Men may shave face and neck.  Do not bring valuables to the hospital.    [facility] is not responsible for any belongings or valuables.               Contacts, dentures or bridgework may not be worn into surgery.  Leave your suitcase in the car. After surgery it may be brought to your room.  For patients admitted to the hospital, discharge time is determined by your treatment team.   Patients discharged the day of surgery will not be allowed to drive home.    Please read over the following fact sheets that you were given:   [facility] Preparing for Surgery and or MRSA Information   _x___ Take these medicines the morning of surgery with A SIP OF WATER:    1. cetirizine (ZYRTEC) 10 MG tablet  2.  3.  4.  5.  6.  ____ Fleet Enema (as directed)   _x___ Use CHG Soap or sage wipes as directed on instruction sheet   ____ Use inhalers on the day of surgery and bring to hospital day of surgery  ____ Stop metformin 2 days prior to surgery    ____ Take 1/2 of usual  insulin dose the night before surgery and none on the morning of           surgery.   ____ Stop aspirin or coumadin, or plavix  _x__ Stop Anti-inflammatories such as Advil, Aleve, Ibuprofen, Motrin, Naproxen,          Naprosyn, Goodies powders or aspirin products. Ok to take Tylenol.   ____ Stop supplements until after surgery.    ____ Bring C-Pap to the hospital.

## 2016-03-05 NOTE — Pre-Procedure Instructions (Signed)
Cardiac clearance on chart. 

## 2016-03-18 ENCOUNTER — Ambulatory Visit: Payer: 59 | Admitting: Anesthesiology

## 2016-03-18 ENCOUNTER — Encounter
Admission: RE | Disposition: A | Discharge status: Home / Self Care | Payer: Self-pay | Source: Ambulatory Visit | Attending: Obstetrics and Gynecology

## 2016-03-18 ENCOUNTER — Encounter: Payer: Self-pay | Admitting: *Deleted

## 2016-03-18 ENCOUNTER — Observation Stay
Admission: RE | Admit: 2016-03-18 | Discharge: 2016-03-19 | Disposition: A | Discharge status: Home / Self Care | Payer: 59 | Source: Ambulatory Visit | Attending: Obstetrics and Gynecology | Admitting: Obstetrics and Gynecology

## 2016-03-18 DIAGNOSIS — I351 Nonrheumatic aortic (valve) insufficiency: Secondary | ICD-10-CM | POA: Diagnosis not present

## 2016-03-18 DIAGNOSIS — N838 Other noninflammatory disorders of ovary, fallopian tube and broad ligament: Secondary | ICD-10-CM | POA: Diagnosis not present

## 2016-03-18 DIAGNOSIS — N941 Unspecified dyspareunia: Secondary | ICD-10-CM | POA: Insufficient documentation

## 2016-03-18 DIAGNOSIS — D251 Intramural leiomyoma of uterus: Secondary | ICD-10-CM | POA: Diagnosis not present

## 2016-03-18 DIAGNOSIS — N72 Inflammatory disease of cervix uteri: Secondary | ICD-10-CM | POA: Diagnosis not present

## 2016-03-18 DIAGNOSIS — Z882 Allergy status to sulfonamides status: Secondary | ICD-10-CM | POA: Insufficient documentation

## 2016-03-18 DIAGNOSIS — D259 Leiomyoma of uterus, unspecified: Secondary | ICD-10-CM | POA: Diagnosis not present

## 2016-03-18 DIAGNOSIS — Z79899 Other long term (current) drug therapy: Secondary | ICD-10-CM | POA: Diagnosis not present

## 2016-03-18 DIAGNOSIS — Z833 Family history of diabetes mellitus: Secondary | ICD-10-CM | POA: Insufficient documentation

## 2016-03-18 DIAGNOSIS — Z9889 Other specified postprocedural states: Secondary | ICD-10-CM

## 2016-03-18 DIAGNOSIS — N9412 Deep dyspareunia: Secondary | ICD-10-CM | POA: Diagnosis not present

## 2016-03-18 HISTORY — PX: BILATERAL SALPINGECTOMY: SHX5743

## 2016-03-18 HISTORY — PX: VAGINAL HYSTERECTOMY: SHX2639

## 2016-03-18 LAB — POCT PREGNANCY, URINE: Preg Test, Ur: NEGATIVE

## 2016-03-18 SURGERY — HYSTERECTOMY, VAGINAL
Anesthesia: General

## 2016-03-18 MED ORDER — PROPOFOL 10 MG/ML IV BOLUS
INTRAVENOUS | Status: DC | PRN
  Administered 2016-03-18: 150 mg via INTRAVENOUS

## 2016-03-18 MED ORDER — LIDOCAINE-EPINEPHRINE 1 %-1:100000 IJ SOLN
INTRAMUSCULAR | Status: AC
  Filled 2016-03-18: qty 1

## 2016-03-18 MED ORDER — LIDOCAINE-EPINEPHRINE 1 %-1:100000 IJ SOLN
INTRAMUSCULAR | Status: DC | PRN
  Administered 2016-03-18: 8 mL

## 2016-03-18 MED ORDER — ONDANSETRON HCL 4 MG/2ML IJ SOLN
INTRAMUSCULAR | Status: DC | PRN
  Administered 2016-03-18: 4 mg via INTRAVENOUS

## 2016-03-18 MED ORDER — FENTANYL CITRATE (PF) 100 MCG/2ML IJ SOLN
INTRAMUSCULAR | Status: DC | PRN
  Administered 2016-03-18: 100 ug via INTRAVENOUS

## 2016-03-18 MED ORDER — FAMOTIDINE 20 MG PO TABS
ORAL_TABLET | ORAL | Status: AC
  Administered 2016-03-18: 20 mg via ORAL
  Filled 2016-03-18: qty 1

## 2016-03-18 MED ORDER — ONDANSETRON HCL 4 MG/2ML IJ SOLN
4.0000 mg | Freq: Once | INTRAMUSCULAR | Status: AC | PRN
  Administered 2016-03-18: 4 mg via INTRAVENOUS

## 2016-03-18 MED ORDER — LACTATED RINGERS IV SOLN: INTRAVENOUS | Status: DC

## 2016-03-18 MED ORDER — FENTANYL CITRATE (PF) 100 MCG/2ML IJ SOLN
INTRAMUSCULAR | Status: AC
  Administered 2016-03-18: 25 ug via INTRAVENOUS
  Filled 2016-03-18: qty 2

## 2016-03-18 MED ORDER — ONDANSETRON HCL 4 MG/2ML IJ SOLN
INTRAMUSCULAR | Status: AC
  Filled 2016-03-18: qty 2

## 2016-03-18 MED ORDER — FAMOTIDINE 20 MG PO TABS
20.0000 mg | ORAL_TABLET | Freq: Once | ORAL | Status: AC
  Administered 2016-03-18: 20 mg via ORAL

## 2016-03-18 MED ORDER — LACTATED RINGERS IV SOLN
INTRAVENOUS | Status: DC
  Administered 2016-03-18 – 2016-03-19 (×2): via INTRAVENOUS

## 2016-03-18 MED ORDER — CEFOXITIN SODIUM-DEXTROSE 2-2.2 GM-% IV SOLR (PREMIX)
INTRAVENOUS | Status: AC
  Administered 2016-03-18: 2000 mg via INTRAVENOUS
  Filled 2016-03-18: qty 50

## 2016-03-18 MED ORDER — LACTATED RINGERS IV SOLN
INTRAVENOUS | Status: DC | PRN
Start: 2016-03-18 — End: 2016-03-18
  Administered 2016-03-18: 08:00:00 via INTRAVENOUS

## 2016-03-18 MED ORDER — SOD CITRATE-CITRIC ACID 500-334 MG/5ML PO SOLN
30.0000 mL | ORAL | Status: DC
  Filled 2016-03-18: qty 30

## 2016-03-18 MED ORDER — ROCURONIUM BROMIDE 100 MG/10ML IV SOLN
INTRAVENOUS | Status: DC | PRN
  Administered 2016-03-18: 50 mg via INTRAVENOUS

## 2016-03-18 MED ORDER — DEXAMETHASONE SODIUM PHOSPHATE 10 MG/ML IJ SOLN
INTRAMUSCULAR | Status: DC | PRN
  Administered 2016-03-18: 5 mg via INTRAVENOUS

## 2016-03-18 MED ORDER — MORPHINE SULFATE (PF) 2 MG/ML IV SOLN
1.0000 mg | INTRAVENOUS | Status: DC | PRN
  Administered 2016-03-18 (×2): 2 mg via INTRAVENOUS
  Filled 2016-03-18 (×2): qty 1

## 2016-03-18 MED ORDER — CEFOXITIN SODIUM-DEXTROSE 2-2.2 GM-% IV SOLR (PREMIX)
2.0000 g | INTRAVENOUS | Status: AC
  Administered 2016-03-18: 2000 mg via INTRAVENOUS

## 2016-03-18 MED ORDER — KETOROLAC TROMETHAMINE 30 MG/ML IJ SOLN
INTRAMUSCULAR | Status: DC | PRN
  Administered 2016-03-18: 30 mg via INTRAVENOUS

## 2016-03-18 MED ORDER — ONDANSETRON HCL 4 MG PO TABS: 4.0000 mg | ORAL_TABLET | Freq: Four times a day (QID) | ORAL | Status: DC | PRN

## 2016-03-18 MED ORDER — ONDANSETRON HCL 4 MG/2ML IJ SOLN
4.0000 mg | Freq: Four times a day (QID) | INTRAMUSCULAR | Status: DC | PRN
  Administered 2016-03-18: 4 mg via INTRAVENOUS
  Filled 2016-03-18: qty 2

## 2016-03-18 MED ORDER — FENTANYL CITRATE (PF) 100 MCG/2ML IJ SOLN
25.0000 ug | INTRAMUSCULAR | Status: AC | PRN
  Administered 2016-03-18 (×6): 25 ug via INTRAVENOUS

## 2016-03-18 MED ORDER — SUGAMMADEX SODIUM 200 MG/2ML IV SOLN
INTRAVENOUS | Status: DC | PRN
  Administered 2016-03-18: 156 mg via INTRAVENOUS

## 2016-03-18 MED ORDER — HYDROCODONE-ACETAMINOPHEN 5-325 MG PO TABS
1.0000 | ORAL_TABLET | ORAL | Status: DC | PRN
  Administered 2016-03-18 – 2016-03-19 (×5): 2 via ORAL
  Filled 2016-03-18 (×5): qty 2

## 2016-03-18 MED ORDER — IBUPROFEN 800 MG PO TABS
800.0000 mg | ORAL_TABLET | Freq: Three times a day (TID) | ORAL | Status: DC | PRN
  Administered 2016-03-18 – 2016-03-19 (×3): 800 mg via ORAL
  Filled 2016-03-18 (×3): qty 1

## 2016-03-18 MED ORDER — LACTATED RINGERS IV SOLN
INTRAVENOUS | Status: DC
  Administered 2016-03-18: 07:00:00 via INTRAVENOUS

## 2016-03-18 MED ORDER — MIDAZOLAM HCL 2 MG/2ML IJ SOLN
INTRAMUSCULAR | Status: DC | PRN
  Administered 2016-03-18: 2 mg via INTRAVENOUS

## 2016-03-18 SURGICAL SUPPLY — 30 items
BAG URO DRAIN 2000ML W/SPOUT (MISCELLANEOUS) ×3 IMPLANT
CANISTER SUCT 1200ML W/VALVE (MISCELLANEOUS) ×3 IMPLANT
CATH FOLEY 2WAY  5CC 16FR (CATHETERS) ×1
CATH ROBINSON RED A/P 16FR (CATHETERS) ×3 IMPLANT
CATH URTH 16FR FL 2W BLN LF (CATHETERS) ×2 IMPLANT
DRAPE PERI LITHO V/GYN (MISCELLANEOUS) ×3 IMPLANT
DRAPE SURG 17X11 SM STRL (DRAPES) ×3 IMPLANT
DRAPE UNDER BUTTOCK W/FLU (DRAPES) ×3 IMPLANT
ELECT REM PT RETURN 9FT ADLT (ELECTROSURGICAL) ×3
ELECTRODE REM PT RTRN 9FT ADLT (ELECTROSURGICAL) ×2 IMPLANT
GLOVE BIO SURGEON STRL SZ8 (GLOVE) ×3 IMPLANT
GOWN STRL REUS W/ TWL LRG LVL3 (GOWN DISPOSABLE) ×6 IMPLANT
GOWN STRL REUS W/ TWL XL LVL3 (GOWN DISPOSABLE) ×2 IMPLANT
GOWN STRL REUS W/TWL LRG LVL3 (GOWN DISPOSABLE) ×3
GOWN STRL REUS W/TWL XL LVL3 (GOWN DISPOSABLE) ×1
KIT RM TURNOVER CYSTO AR (KITS) ×3 IMPLANT
LABEL OR SOLS (LABEL) IMPLANT
NDL SAFETY 22GX1.5 (NEEDLE) ×3 IMPLANT
PACK BASIN MINOR ARMC (MISCELLANEOUS) ×3 IMPLANT
PAD OB MATERNITY 4.3X12.25 (PERSONAL CARE ITEMS) ×3 IMPLANT
PAD PREP 24X41 OB/GYN DISP (PERSONAL CARE ITEMS) ×3 IMPLANT
SUT PDS 2-0 27IN (SUTURE) ×3 IMPLANT
SUT VIC AB 0 CT1 27 (SUTURE)
SUT VIC AB 0 CT1 27XCR 8 STRN (SUTURE) IMPLANT
SUT VIC AB 0 CT1 36 (SUTURE) ×9 IMPLANT
SUT VIC AB 2-0 SH 27 (SUTURE) ×1
SUT VIC AB 2-0 SH 27XBRD (SUTURE) ×2 IMPLANT
SYR CONTROL 10ML (SYRINGE) ×3 IMPLANT
SYRINGE 10CC LL (SYRINGE) ×3 IMPLANT
WATER STERILE IRR 1000ML POUR (IV SOLUTION) ×3 IMPLANT

## 2016-03-18 NOTE — Progress Notes (Signed)
Patient ID: Laura Schmitt, female   DOB: 26-Aug-1971, 45 y.o.   MRN: [id number] DOS TVH+ bilat salpingectomy  O: VSS No blood on perineum  OU good  A: stable  P: anticipate d/c in am

## 2016-03-18 NOTE — Op Note (Signed)
NAME:  Laura Schmitt, UPSON NO.:  [id number]  MEDICAL RECORD NO.:  [id number]  LOCATION:  346A                         FACILITY:  [facility]  PHYSICIAN:  Laverta Baltimore, MDDATE OF BIRTH:  [birth year]  DATE OF PROCEDURE:  03/18/2016 DATE OF DISCHARGE:                              OPERATIVE REPORT   PREOPERATIVE DIAGNOSIS:  Dyspareunia.  POSTOPERATIVE DIAGNOSIS:  Dyspareunia.  PROCEDURE: 1. Total vaginal hysterectomy. 2. Bilateral salpingectomy.  ANESTHESIA:  General endotracheal anesthesia.  SURGEON:  Laverta Baltimore, MD.  FIRST ASSISTANT:  Leafy Ro.  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  The patient with no referring pelvic pain otherwise.  The patient is known to have a posterior uterine fibroid.  DESCRIPTION OF PROCEDURE:  After adequate general endotracheal anesthesia, the patient was placed in dorsal supine position.  Legs in the candy-cane stirrups.  The patient's lower abdomen, perineum, and vagina were prepped and draped in normal sterile fashion.  A time-out was performed.  The patient did receive 2 g of IV cefoxitin prior to commencement of the case.  A red Robinson catheter was placed and removed yielding 150 mL clear urine.  A weighted speculum was placed in the posterior vaginal vault and the cervix was grasped with 2 thyroid tenacula.  The cervix was circumferentially injected with 1% lidocaine with 1:100,000 epinephrine.  A direct posterior colpotomy incision was made upon entry into the posterior cul-de-sac.  A long weighted bill speculum was placed.  The uterosacral ligaments were bilaterally clamped, transected, and suture ligated with 0 Vicryl suture.  The anterior cervix was circumferentially incised with the Bovie.  The cardinal ligaments were bilaterally clamped, transected, and suture ligated with 0 Vicryl suture.  The uterine arteries  were bilaterally clamped, transected, and suture ligated with 0 Vicryl suture.  The anterior cul-de-sac was then entered sharply without difficulty.  A Deaver retractor was placed within to elevate the bladder anteriorly. Given the size of the uterus, the uterus was decompressed and cored out with the Bovie.  The cornua were then bilaterally clamped, transected, and suture ligated with 0 Vicryl suture.  The uterus was delivered.  The fallopian tubes each were grasped with Babcock clamps and mesosalpinx was clamped and each fallopian tube was removed and each pedicle was secured with 0 Vicryl suture.  The ovaries appear normal.  Good hemostasis was noted.  The peritoneum was closed with a 2-0 PDS suture in a pursestring fashion and the vaginal cuff was then closed with a running 0 Vicryl suture.  The uterosacral ligaments were plicated centrally and the rest of the vaginal vault was closed.  One additional figure-of-eight was used centrally for hemostasis.  Good hemostasis was noted.  Foley catheter was placed yielding clear urine.  Additional 50 mL removed.  There were no complications.  The patient tolerated the procedure well.  ESTIMATED BLOOD LOSS:  100 mL.  INTRAOPERATIVE FLUIDS:  1000 mL.  URINE OUTPUT:  200 mL.  The patient was taken to recovery room in good condition.          ______________________________ Laverta Baltimore, MD  TS/MEDQ  D:  03/18/2016  T:  03/18/2016  Job:  [id number]

## 2016-03-18 NOTE — Anesthesia Preprocedure Evaluation (Signed)
Anesthesia Evaluation  Patient identified by MRN, date of birth, ID band  Reviewed: Allergy & Precautions, NPO status , Patient's Chart, lab work & pertinent test results  History of Anesthesia Complications Negative for: history of anesthetic complications  Airway Mallampati: II       Dental   Pulmonary neg pulmonary ROS,           Cardiovascular negative cardio ROS  + Valvular Problems/Murmurs AI      Neuro/Psych negative neurological ROS     GI/Hepatic negative GI ROS, Neg liver ROS,   Endo/Other  negative endocrine ROS  Renal/GU negative Renal ROS     Musculoskeletal   Abdominal   Peds  Hematology negative hematology ROS (+)   Anesthesia Other Findings   Reproductive/Obstetrics                             Anesthesia Physical Anesthesia Plan  ASA: II  Anesthesia Plan: General   Post-op Pain Management:    Induction: Intravenous  Airway Management Planned: Oral ETT  Additional Equipment:   Intra-op Plan:   Post-operative Plan:   Informed Consent: I have reviewed the patients History and Physical, chart, labs and discussed the procedure including the risks, benefits and alternatives for the proposed anesthesia with the patient or authorized representative who has indicated his/her understanding and acceptance.     Plan Discussed with:   Anesthesia Plan Comments:         Anesthesia Quick Evaluation

## 2016-03-18 NOTE — Brief Op Note (Signed)
03/18/2016  9:04 AM  PATIENT:  Laura Schmitt  45 y.o. female  PRE-OPERATIVE DIAGNOSIS:  Dyspareunia, fibroid  POST-OPERATIVE DIAGNOSIS:  Dyspareunia, fibroid  PROCEDURE:  Procedure(s): HYSTERECTOMY VAGINAL (N/A) BILATERAL SALPINGECTOMY (Bilateral)  SURGEON:  Surgeon(s) and Role:    * Boykin Nearing, MD - Primary    * Benjaman Kindler, MD - Assisting  PHYSICIAN ASSISTANT: scrub tech   ASSISTANTS: none   ANESTHESIA:   general  EBL:  Total I/O In: -  Out: 300 [Urine:200; Blood:100] IOF : 1000cc  BLOOD ADMINISTERED:none  DRAINS: Urinary Catheter (Foley)   LOCAL MEDICATIONS USED:  LIDOCAINE  and Amount: 9 ml  SPECIMEN:  Source of Specimen:  cx , uterus and fallopian tubes   DISPOSITION OF SPECIMEN:  PATHOLOGY  COUNTS:  YES  TOURNIQUET:  * No tourniquets in log *  DICTATION: .Other Dictation: Dictation Number verbal  PLAN OF CARE: Admit for overnight observation  PATIENT DISPOSITION:  PACU - hemodynamically stable.   Delay start of Pharmacological VTE agent (>24hrs) due to surgical blood loss or risk of bleeding: not applicable

## 2016-03-18 NOTE — Anesthesia Postprocedure Evaluation (Signed)
Anesthesia Post Note  Patient: Laura Schmitt  Procedure(s) Performed: Procedure(s) (LRB): HYSTERECTOMY VAGINAL (N/A) BILATERAL SALPINGECTOMY (Bilateral)  Patient location during evaluation: PACU Anesthesia Type: General Level of consciousness: awake and alert Pain management: pain level controlled Vital Signs Assessment: post-procedure vital signs reviewed and stable Respiratory status: spontaneous breathing and respiratory function stable Cardiovascular status: stable Anesthetic complications: no    Last Vitals:  Filed Vitals:   03/18/16 0617 03/18/16 0915  BP: 126/55 137/52  Pulse: 78 92  Temp: 36.1 C 36.7 C  Resp: 14 9    Last Pain:  Filed Vitals:   03/18/16 0918  PainSc: 7                  [name] K

## 2016-03-18 NOTE — Anesthesia Procedure Notes (Signed)
Procedure Name: Intubation Date/Time: 03/18/2016 7:37 AM Performed by: Jennette Bill Pre-anesthesia Checklist: Patient identified Patient Re-evaluated:Patient Re-evaluated prior to inductionOxygen Delivery Method: Circle system utilized Preoxygenation: Pre-oxygenation with 100% oxygen Laryngoscope Size: Mac and 3 Grade View: Grade II Tube type: Oral Tube size: 7.0 mm Number of attempts: 1 Airway Equipment and Method: Patient positioned with wedge pillow and Stylet Placement Confirmation: ETT inserted through vocal cords under direct vision,  positive ETCO2,  CO2 detector and breath sounds checked- equal and bilateral Secured at: 23 cm Tube secured with: Tape Dental Injury: Teeth and Oropharynx as per pre-operative assessment

## 2016-03-18 NOTE — Transfer of Care (Signed)
Immediate Anesthesia Transfer of Care Note  Patient: Laura Schmitt  Procedure(s) Performed: Procedure(s): HYSTERECTOMY VAGINAL (N/A) BILATERAL SALPINGECTOMY (Bilateral)  Patient Location: PACU  Anesthesia Type:General  Level of Consciousness: awake, alert  and oriented  Airway & Oxygen Therapy: Patient Spontanous Breathing and Patient connected to nasal cannula oxygen  Post-op Assessment: Report given to RN and Post -op Vital signs reviewed and stable  Post vital signs: Reviewed and stable  Last Vitals:  Filed Vitals:   03/18/16 0617 03/18/16 0915  BP: 126/55 137/52  Pulse: 78 92  Temp: 36.1 C 36.7 C  Resp: 14 9    Last Pain: There were no vitals filed for this visit.       Complications: No apparent anesthesia complications

## 2016-03-18 NOTE — Progress Notes (Signed)
Pt ready for TVH and bilateral salpingectomy . Labs reviewed . Neg HCG . All questions answered . NPO . Proceed

## 2016-03-19 DIAGNOSIS — I351 Nonrheumatic aortic (valve) insufficiency: Secondary | ICD-10-CM | POA: Diagnosis not present

## 2016-03-19 DIAGNOSIS — Z833 Family history of diabetes mellitus: Secondary | ICD-10-CM | POA: Diagnosis not present

## 2016-03-19 DIAGNOSIS — Z79899 Other long term (current) drug therapy: Secondary | ICD-10-CM | POA: Diagnosis not present

## 2016-03-19 DIAGNOSIS — Z882 Allergy status to sulfonamides status: Secondary | ICD-10-CM | POA: Diagnosis not present

## 2016-03-19 DIAGNOSIS — N941 Unspecified dyspareunia: Secondary | ICD-10-CM | POA: Diagnosis not present

## 2016-03-19 DIAGNOSIS — N838 Other noninflammatory disorders of ovary, fallopian tube and broad ligament: Secondary | ICD-10-CM | POA: Diagnosis not present

## 2016-03-19 DIAGNOSIS — N72 Inflammatory disease of cervix uteri: Secondary | ICD-10-CM | POA: Diagnosis not present

## 2016-03-19 LAB — CBC
HEMATOCRIT: 28.1 % — AB (ref 35.0–47.0)
HEMOGLOBIN: 9.4 g/dL — AB (ref 12.0–16.0)
MCH: 25.2 pg — AB (ref 26.0–34.0)
MCHC: 33.4 g/dL (ref 32.0–36.0)
MCV: 75.6 fL — AB (ref 80.0–100.0)
Platelets: 317 10*3/uL (ref 150–440)
RBC: 3.71 MIL/uL — AB (ref 3.80–5.20)
RDW: 15 % — ABNORMAL HIGH (ref 11.5–14.5)
WBC: 16 10*3/uL — ABNORMAL HIGH (ref 3.6–11.0)

## 2016-03-19 LAB — SURGICAL PATHOLOGY

## 2016-03-19 LAB — COMPREHENSIVE METABOLIC PANEL
ALK PHOS: 73 U/L (ref 38–126)
ALT: 14 U/L (ref 14–54)
ANION GAP: 5 (ref 5–15)
AST: 20 U/L (ref 15–41)
Albumin: 3 g/dL — ABNORMAL LOW (ref 3.5–5.0)
BILIRUBIN TOTAL: 0.5 mg/dL (ref 0.3–1.2)
BUN: 9 mg/dL (ref 6–20)
CALCIUM: 8.2 mg/dL — AB (ref 8.9–10.3)
CO2: 29 mmol/L (ref 22–32)
CREATININE: 0.69 mg/dL (ref 0.44–1.00)
Chloride: 106 mmol/L (ref 101–111)
GFR calc non Af Amer: >60 mL/min (ref >60)
Glucose, Bld: 95 mg/dL (ref 65–99)
Potassium: 3.4 mmol/L — ABNORMAL LOW (ref 3.5–5.1)
SODIUM: 140 mmol/L (ref 135–145)
TOTAL PROTEIN: 5.9 g/dL — AB (ref 6.5–8.1)

## 2016-03-19 MED ORDER — IBUPROFEN 800 MG PO TABS: 800.0000 mg | ORAL_TABLET | Freq: Three times a day (TID) | ORAL | Status: AC | PRN

## 2016-03-19 MED ORDER — HYDROCODONE-ACETAMINOPHEN 5-325 MG PO TABS: 1.0000 | ORAL_TABLET | ORAL | Status: DC | PRN

## 2016-03-19 MED ORDER — DOCUSATE SODIUM 100 MG PO CAPS: 100.0000 mg | ORAL_CAPSULE | Freq: Two times a day (BID) | ORAL | Status: DC

## 2016-03-19 MED ORDER — ONDANSETRON HCL 4 MG PO TABS: 4.0000 mg | ORAL_TABLET | Freq: Four times a day (QID) | ORAL | Status: DC | PRN

## 2016-03-19 NOTE — Progress Notes (Signed)
Discharge instructions complete and prescriptions given. Patient verbalizes understanding of teaching. Patient discharged home at 1300

## 2016-03-19 NOTE — Discharge Summary (Signed)
Physician Discharge Summary  Patient ID: Laura Schmitt MRN: [id number] DOB/AGE: May 01, 1971 45 y.o.  Admit date: 03/18/2016 Discharge date: 03/19/2016  Admission Diagnoses:  Discharge Diagnoses:  Active Problems:   Post-operative state   Discharged Condition: good  Hospital Course: pt underwent an uncomplicated TVH + Bilateral salpingectomy  No complication post op   Consults: None  Significant Diagnostic Studies: labs: hct 28 , K+ = 3.4   Treatments: surgery: as above   Discharge Exam: Blood pressure 123/59, pulse 67, temperature 98 F (36.7 C), temperature source Oral, resp. rate 16, height 5\' 3"  (1.6 m), weight 172 lb (78.019 kg), SpO2 99 %. General appearance: alert and cooperative Resp: clear to auscultation bilaterally Cardio: regular rate and rhythm, S1, S2 normal, no murmur, click, rub or gallop GI: soft, non-tender; bowel sounds normal; no masses,  no organomegaly No blood on pad Disposition: 01-Home or Self Care  Discharge Instructions    Call MD for:  difficulty breathing, headache or visual disturbances    Complete by:  As directed      Call MD for:  extreme fatigue    Complete by:  As directed      Call MD for:  hives    Complete by:  As directed      Call MD for:  persistant dizziness or light-headedness    Complete by:  As directed      Call MD for:  persistant nausea and vomiting    Complete by:  As directed      Call MD for:  redness, tenderness, or signs of infection (pain, swelling, redness, odor or green/yellow discharge around incision site)    Complete by:  As directed      Call MD for:  severe uncontrolled pain    Complete by:  As directed      Call MD for:  temperature >100.4    Complete by:  As directed      Diet - low sodium heart healthy    Complete by:  As directed      Increase activity slowly    Complete by:  As directed             Medication List    TAKE these medications        cetirizine 10 MG tablet  Commonly known as:   ZYRTEC  Take 10 mg by mouth daily.     docusate sodium 100 MG capsule  Commonly known as:  COLACE  Take 1 capsule (100 mg total) by mouth 2 (two) times daily.     HYDROcodone-acetaminophen 5-325 MG tablet  Commonly known as:  NORCO/VICODIN  Take 1-2 tablets by mouth every 4 (four) hours as needed for moderate pain.     ibuprofen 800 MG tablet  Commonly known as:  ADVIL,MOTRIN  Take 1 tablet (800 mg total) by mouth every 8 (eight) hours as needed (mild pain).     montelukast 10 MG tablet  Commonly known as:  SINGULAIR  Take 1 tablet (10 mg total) by mouth at bedtime.     ondansetron 4 MG tablet  Commonly known as:  ZOFRAN  Take 1 tablet (4 mg total) by mouth every 6 (six) hours as needed for nausea.     ZZZQUIL 25 MG Caps  Generic drug:  DiphenhydrAMINE HCl (Sleep)  Take 50 mg by mouth at bedtime as needed (sleep). Reported on 03/18/2016           Follow-up Information    Follow up with  Kedron Uno, MD In 2 weeks.   Specialty:  Obstetrics and Gynecology   Why:  For wound re-check   Contact information:   [address] [place] [address] [phone]       Signed: Laverta Baltimore 03/19/2016, 9:05 AM

## 2016-03-22 ENCOUNTER — Encounter: Payer: Self-pay | Admitting: Medical Oncology

## 2016-03-22 ENCOUNTER — Emergency Department: Payer: 59

## 2016-03-22 ENCOUNTER — Other Ambulatory Visit: Payer: Self-pay

## 2016-03-22 ENCOUNTER — Emergency Department
Admission: EM | Admit: 2016-03-22 | Discharge: 2016-03-22 | Disposition: A | Discharge status: Home / Self Care | Payer: 59 | Attending: Emergency Medicine | Admitting: Emergency Medicine

## 2016-03-22 DIAGNOSIS — Z90710 Acquired absence of both cervix and uterus: Secondary | ICD-10-CM | POA: Diagnosis not present

## 2016-03-22 DIAGNOSIS — Z9889 Other specified postprocedural states: Secondary | ICD-10-CM | POA: Diagnosis not present

## 2016-03-22 DIAGNOSIS — R0602 Shortness of breath: Secondary | ICD-10-CM | POA: Diagnosis not present

## 2016-03-22 DIAGNOSIS — G8918 Other acute postprocedural pain: Secondary | ICD-10-CM | POA: Insufficient documentation

## 2016-03-22 DIAGNOSIS — R531 Weakness: Secondary | ICD-10-CM | POA: Diagnosis not present

## 2016-03-22 DIAGNOSIS — Z79899 Other long term (current) drug therapy: Secondary | ICD-10-CM | POA: Insufficient documentation

## 2016-03-22 DIAGNOSIS — R5381 Other malaise: Secondary | ICD-10-CM | POA: Diagnosis not present

## 2016-03-22 DIAGNOSIS — R1030 Lower abdominal pain, unspecified: Secondary | ICD-10-CM | POA: Diagnosis present

## 2016-03-22 DIAGNOSIS — R42 Dizziness and giddiness: Secondary | ICD-10-CM | POA: Insufficient documentation

## 2016-03-22 LAB — CBC WITH DIFFERENTIAL/PLATELET
BASOS ABS: 0 10*3/uL (ref 0–0.1)
Basophils Relative: 0 %
Eosinophils Absolute: 0.7 10*3/uL (ref 0–0.7)
Eosinophils Relative: 6 %
HEMATOCRIT: 30.2 % — AB (ref 35.0–47.0)
HEMOGLOBIN: 10 g/dL — AB (ref 12.0–16.0)
Lymphs Abs: 2.9 10*3/uL (ref 1.0–3.6)
MCH: 24.7 pg — ABNORMAL LOW (ref 26.0–34.0)
MCHC: 33.2 g/dL (ref 32.0–36.0)
MCV: 74.3 fL — AB (ref 80.0–100.0)
MONO ABS: 0.5 10*3/uL (ref 0.2–0.9)
NEUTROS ABS: 7.8 10*3/uL — AB (ref 1.4–6.5)
Neutrophils Relative %: 65 %
Platelets: 348 10*3/uL (ref 150–440)
RBC: 4.07 MIL/uL (ref 3.80–5.20)
RDW: 14.7 % — AB (ref 11.5–14.5)
WBC: 11.9 10*3/uL — ABNORMAL HIGH (ref 3.6–11.0)

## 2016-03-22 LAB — LIPASE, BLOOD: Lipase: 18 U/L (ref 11–51)

## 2016-03-22 LAB — URINALYSIS COMPLETE WITH MICROSCOPIC (ARMC ONLY)
BILIRUBIN URINE: NEGATIVE
GLUCOSE, UA: NEGATIVE mg/dL
KETONES UR: NEGATIVE mg/dL
LEUKOCYTES UA: NEGATIVE
Nitrite: NEGATIVE
PH: 8 (ref 5.0–8.0)
Protein, ur: NEGATIVE mg/dL
Specific Gravity, Urine: 1.02 (ref 1.005–1.030)

## 2016-03-22 LAB — COMPREHENSIVE METABOLIC PANEL
ALBUMIN: 3.4 g/dL — AB (ref 3.5–5.0)
ALT: 26 U/L (ref 14–54)
ANION GAP: 6 (ref 5–15)
AST: 26 U/L (ref 15–41)
Alkaline Phosphatase: 83 U/L (ref 38–126)
BUN: 11 mg/dL (ref 6–20)
CO2: 28 mmol/L (ref 22–32)
Calcium: 8.5 mg/dL — ABNORMAL LOW (ref 8.9–10.3)
Chloride: 103 mmol/L (ref 101–111)
Creatinine, Ser: 0.76 mg/dL (ref 0.44–1.00)
GFR calc Af Amer: >60 mL/min (ref >60)
GFR calc non Af Amer: >60 mL/min (ref >60)
GLUCOSE: 96 mg/dL (ref 65–99)
POTASSIUM: 3.2 mmol/L — AB (ref 3.5–5.1)
SODIUM: 137 mmol/L (ref 135–145)
TOTAL PROTEIN: 6.7 g/dL (ref 6.5–8.1)
Total Bilirubin: 0.1 mg/dL — ABNORMAL LOW (ref 0.3–1.2)

## 2016-03-22 MED ORDER — SODIUM CHLORIDE 0.9 % IV BOLUS (SEPSIS)
1000.0000 mL | Freq: Once | INTRAVENOUS | Status: AC
  Administered 2016-03-22: 1000 mL via INTRAVENOUS

## 2016-03-22 NOTE — ED Notes (Signed)
Patient transported to X-ray 

## 2016-03-22 NOTE — ED Provider Notes (Addendum)
[facility] Emergency Department Provider Note  ____________________________________________   I have reviewed the triage vital signs and the nursing notes.   HISTORY  Chief Complaint Abdominal Pain; Weakness; Shortness of Breath; and Dizziness    HPI Laura Schmitt is a 45 y.o. female who had a vaginal hysterectomy on Monday. Today is Friday. She states she is feeling better but then had some increased cramping yesterday and took some mag citrate at the advice of her surgeon. She had satisfying bowel movements then and felt much better but then today she woke up and she had a constellation of somewhat difficult to describe symptoms. She states she feels tingling in her legs, she does not feel short of breath but she feels that she has to concentrate sometimes when she takes a deep breath. She is no chest pain or shortness of breath no leg swelling. No history of DVT or PE in herself or her family. She has no dysuria or urinary frequency. She is not having significant bleeding effect of bleeding from her vagina as well as stop. The patient has had some mild nausea but was able to eat a barbecue chicken for lunch with no difficulty. She's had no fever. She does feel somewhat lightheaded. She denies true vertigo symptoms. She has no focal numbness or weakness. She feels that her abdominal pain is "about the same" as it has been.    Past Medical History  Diagnosis Date  . Heart murmur     aortic valve     Patient Active Problem List   Diagnosis Date Noted  . Post-operative state 03/18/2016    Past Surgical History  Procedure Laterality Date  . Lt ear cyst    . Sinus exploration    . Vaginal hysterectomy N/A 03/18/2016    Procedure: HYSTERECTOMY VAGINAL;  Surgeon: Boykin Nearing, MD;  Location: [facility] ORS;  Service: Gynecology;  Laterality: N/A;  . Bilateral salpingectomy Bilateral 03/18/2016    Procedure: BILATERAL SALPINGECTOMY;  Surgeon: Boykin Nearing, MD;  Location: [facility] ORS;  Service: Gynecology;  Laterality: Bilateral;    Current Outpatient Rx  Name  Route  Sig  Dispense  Refill  . cetirizine (ZYRTEC) 10 MG tablet   Oral   Take 10 mg by mouth daily.         . DiphenhydrAMINE HCl, Sleep, (ZZZQUIL) 25 MG CAPS   Oral   Take 50 mg by mouth at bedtime as needed (sleep). Reported on 03/18/2016         . docusate sodium (COLACE) 100 MG capsule   Oral   Take 1 capsule (100 mg total) by mouth 2 (two) times daily.   30 capsule   0   . HYDROcodone-acetaminophen (NORCO/VICODIN) 5-325 MG tablet   Oral   Take 1-2 tablets by mouth every 4 (four) hours as needed for moderate pain.   30 tablet   0   . ibuprofen (ADVIL,MOTRIN) 800 MG tablet   Oral   Take 1 tablet (800 mg total) by mouth every 8 (eight) hours as needed (mild pain).   60 tablet   0   . montelukast (SINGULAIR) 10 MG tablet   Oral   Take 1 tablet (10 mg total) by mouth at bedtime.   30 tablet   3   . ondansetron (ZOFRAN) 4 MG tablet   Oral   Take 1 tablet (4 mg total) by mouth every 6 (six) hours as needed for nausea.   20 tablet  0     Allergies Sulfa antibiotics  No family history on file.  Social History Social History  Substance Use Topics  . Smoking status: Never Smoker   . Smokeless tobacco: None  . Alcohol Use: No    Review of Systems Constitutional: No fever/chills Eyes: No visual changes. ENT: No sore throat. No stiff neck no neck pain Cardiovascular: Denies chest pain. Respiratory: Denies shortness of breath.See history of present illness Gastrointestinal:   no vomiting.  No diarrhea.  See history of present illness Genitourinary: Negative for dysuria. Musculoskeletal: Negative lower extremity swelling Skin: Negative for rash. Neurological: Negative for headaches, focal weakness or numbness. 10-point ROS otherwise negative.  ____________________________________________   PHYSICAL EXAM:  VITAL SIGNS: ED Triage  Vitals  Enc Vitals Group     BP 03/22/16 1505 140/55 mmHg     Pulse Rate 03/22/16 1504 70     Resp 03/22/16 1504 16     Temp 03/22/16 1504 98.2 F (36.8 C)     Temp Source 03/22/16 1504 Oral     SpO2 03/22/16 1504 100 %     Weight 03/22/16 1504 170 lb (77.111 kg)     Height 03/22/16 1504 5\' 3"  (1.6 m)     Head Cir --      Peak Flow --      Pain Score 03/22/16 1504 2     Pain Loc --      Pain Edu? --      Excl. in [place]? --     Constitutional: Alert and oriented. Well appearing and in no acute distress. Eyes: Conjunctivae are normal. PERRL. EOMI. Head: Atraumatic. Nose: No congestion/rhinnorhea. Mouth/Throat: Mucous membranes are moist.  Oropharynx non-erythematous. Neck: No stridor.   Nontender with no meningismus Cardiovascular: Normal rate, regular rhythm. Grossly normal heart sounds.  Good peripheral circulation. Respiratory: Normal respiratory effort.  No retractions. Lungs CTAB. Abdominal: Soft Noted is tenderness to palpation in the lower abdomen with no guarding or rebound. No distention. No guarding no rebound Back:  There is no focal tenderness or step off there is no midline tenderness there are no lesions noted. there is no CVA tenderness Musculoskeletal: No lower extremity tenderness. No joint effusions, no DVT signs strong distal pulses no edema Neurologic:  Normal speech and language. No gross focal neurologic deficits are appreciated.  Skin:  Skin is warm, dry and intact. No rash noted. Psychiatric: Mood and affect are normal. Speech and behavior are normal.  ____________________________________________   LABS (all labs ordered are listed, but only abnormal results are displayed)  Labs Reviewed  COMPREHENSIVE METABOLIC PANEL  CBC WITH DIFFERENTIAL/PLATELET  URINALYSIS COMPLETEWITH MICROSCOPIC ([facility] ONLY)  LIPASE, BLOOD   ____________________________________________  EKG  I personally interpreted any EKGs ordered by me or triage Normal sinus rhythm at 61  bpm no acute ST elevation or acute ST depression normal axis unremarkable EKG ____________________________________________  RADIOLOGY  I reviewed any imaging ordered by me or triage that were performed during my shift and, if possible, patient and/or family made aware of any abnormal findings. ____________________________________________   PROCEDURES  Procedure(s) performed: None  Critical Care performed: None  ____________________________________________   INITIAL IMPRESSION / ASSESSMENT AND PLAN / ED COURSE  Pertinent labs & imaging results that were available during my care of the patient were reviewed by me and considered in my medical decision making (see chart for details).  Patient with a constellation of somewhat poorly described symptoms postoperative day 4 after her hysterectomy. Reassuringly, her vital signs  are normal she is not tachycardic or hypotensive and her sats are 100%. She is no evidence of difficulty breathing. She is not febrile here and has not been febrile at home. It is difficult to reconcile some of her symptoms, she does have some abdominal tenderness which I think is to be expected. She has what I would describe as a very poor history for a possible PE with lower abdominal discomfort and no actual shortness of breath but the need to "concentrate" to take a deep breath, which is somewhat difficult to quantify. There is no evidence of sepsis or infection. Patient could be anemic she could be having internal bleeding she could have a PE although again not likely, EKG doesn't show any acute coronary issues although she does have history of aortic insufficiency, she has no history of ischemia and again that is quite reassuring. My plan is to check urine, blood work, chest x-ray, and then discussed with OB/GYN. We will also give her IV fluid as she feels somewhat dehydrated clinically she appears to be well appearing. She has no focal numbness or weakness is nothing to  suggest dural venous thrombosis or other intracranial pathology she just feels generally weak "all over". Nonetheless, she  appears to be quite normal in her activity level.  ----------------------------------------- 4:55 PM on 03/22/2016 -----------------------------------------  Discussed with Dr. Dineen Kid, who agrees with management and at this time feels no CT or other workup is indicated. However, she graciously offered to come see the patient which I think is unreasonable given the recent surgical status of the patient. We will defer to them. Patient still has no chest pain or shortness of breath. Sats of 100% every time I have been in the room chest x-ray shows no pneumonia urinalysis shows no evidence of infection hemoglobin is not indicating any evidence of a downward trend, white count is greatly improved of her surgical time, and a short physical exam vitals and blood work and imaging thus far quite reassuring.  ----------------------------------------- 5:05 PM on 03/22/2016 -----------------------------------------  Dr. Dineen Kid was kind enough to come see the patient. She does not wish to obtain a CT of the chest or abdomen in this patient. This is obviously an OB/GYN post operative patient and we will defer to their judgment. As noted above, very low suspicion for acute pathology including abscess, bleed, PE, or any other significant pathology of these Fridays.. Extensive return precautions given and understood. OB/GYN will follow up with her closely tomorrow. Patient very comfortable with this plan. Feeling much better after IV fluid. No chest pain no shortness of breath while she was here and at no time as she had any ____________________________________________   FINAL CLINICAL IMPRESSION(S) / ED DIAGNOSES  Final diagnoses:  None      This chart was dictated using voice recognition software.  Despite best efforts to proofread,  errors can occur which can change meaning.      Schuyler Amor, MD 03/22/16 1618  Schuyler Amor, MD 03/22/16 [id number]  Schuyler Amor, MD 03/22/16 [phone]

## 2016-03-22 NOTE — ED Notes (Signed)
Patient returned from radiology

## 2016-03-22 NOTE — Discharge Instructions (Signed)
If you have increased pain, fever, vomiting, lightheadedness, shortness of breath, difficulty breathing, chest pain, or you feel worse in any way, return to the emergency department.  Follow up closely with your OBGYN.

## 2016-03-22 NOTE — ED Notes (Signed)
Pt reports that she had total hysterectomy vaginally Monday and yesterday pt reports she felt constipated so she took some mag citrate and about 1.5 hrs later she had a BM. Pt reports she continues to have pain, sob, fatigue, lightheaded feeling and tingling to bilt lower extremities.

## 2016-03-22 NOTE — Consult Note (Signed)
[facility] NOTE:  Reason for Consult:s/p TVH on Monday, 6/19, new fatigue, difficulties taking a breath   Laura Schmitt is an 45 y.o. female. S/p TVH with Dr Ouida Sills on 03/18/16. Uncomplicated surgery. She was doing well until yesterday when she needed to take mag citrate to have a bowel movement. She strained to have the bowel movement. Today she felt weak and needed to concentrate to take a deep breath. She also noticed tingling in her arms and legs. She endorses some discomfort under her left knee. No fevers/chills/vomiting/vaginal bleeding/abnormal vaginal discharge. Min abdominal pain today compared to the week. No chest pain. No leg swelling.    Voiding without difficulty.   OB/GYN History: OB History   Gravida Para Term Preterm AB TAB SAB Ectopic Multiple Living   _0 Patient's last menstrual period was 02/09/2016.    Past Medical History  Diagnosis Date  . Heart murmur     aortic valve     Past Surgical History  Procedure Laterality Date  . Lt ear cyst    . Sinus exploration    . Vaginal hysterectomy N/A 03/18/2016    Procedure: HYSTERECTOMY VAGINAL;  Surgeon: Boykin Nearing, MD;  Location: [facility] ORS;  Service: Gynecology;  Laterality: N/A;  . Bilateral salpingectomy Bilateral 03/18/2016    Procedure: BILATERAL SALPINGECTOMY;  Surgeon: Boykin Nearing, MD;  Location: [facility] ORS;  Service: Gynecology;  Laterality: Bilateral;    No family history on file.  Social History:  reports that she has never smoked. She does not have any smokeless tobacco history on file. She reports that she does not drink alcohol or use illicit drugs.  Allergies:  Allergies  Allergen Reactions  . Sulfa Antibiotics Rash   Medications:  Current Outpatient Prescriptions:  .beclomethasone dipropionate (QNASL) 40 mcg/actuation HFAA, 2 sprays by Nasal route once daily., Disp: , Rfl:  .cetirizine (ZYRTEC) 10 mg  capsule, Take 10 mg by mouth once daily., Disp: , Rfl:  .oxyCODONE-acetaminophen (PERCOCET) 5-325 mg tablet, Take by mouth., Disp: , Rfl:   ROS Neg except for above  Blood pressure 140/55, pulse 70, temperature 98.2 F (36.8 C), temperature source Oral, resp. rate 16, height _1  (1.6 m), weight 77.111 kg (170 lb), last menstrual period 02/09/2016, SpO2 100 %. Physical Exam General appearance: alert, cooperative and no distress Head: Normocephalic, without obvious abnormality, atraumatic  Cardiovascular: Normal rate, regular rhythm. Grossly normal heart sounds. Good peripheral circulation. Respiratory: Normal respiratory effort. No retractions. Lungs CTAB. Abdomen: soft, bowel sounds normal; no masses,  no organomegaly and appropriately tender with deep palpation.  Extremities: extremities normal, atraumatic, no cyanosis or edema; no asymmetric swelling/erythema, homan's sign neg; palpation of left popliteal area neg for masses, warmth or TTP Skin: Skin color, texture, turgor normal. No rashes or lesions  Pelvic exam deferred as patient w/o complaints and recently post-op  Results for orders placed or performed during the hospital encounter of 03/22/16 (from the past 48 hour(s))  Comprehensive metabolic panel     Status: Abnormal   Collection Time: 03/22/16  3:10 PM  Result Value Ref Range   Sodium 137 135 - 145 mmol/L   Potassium 3.2 (L) 3.5 - 5.1 mmol/L   Chloride 103 101 - 111 mmol/L   CO2 28 22 - 32 mmol/L   Glucose, Bld 96 65 - 99 mg/dL   BUN 11 6 - 20 mg/dL  Creatinine, Ser 0.76 0.44 - 1.00 mg/dL   Calcium 8.5 (L) 8.9 - 10.3 mg/dL   Total Protein 6.7 6.5 - 8.1 g/dL   Albumin 3.4 (L) 3.5 - 5.0 g/dL   AST 26 15 - 41 U/L   ALT 26 14 - 54 U/L   Alkaline Phosphatase 83 38 - 126 U/L   Total Bilirubin 0.1 (L) 0.3 - 1.2 mg/dL   GFR calc non Af Amer >60 >60 mL/min   GFR calc Af Amer >60 >60 mL/min    Comment: (NOTE) The eGFR has been calculated using the CKD EPI  equation. This calculation has not been validated in all clinical situations. eGFR's persistently <60 mL/min signify possible Chronic Kidney Disease.    Anion gap 6 5 - 15  CBC with Differential     Status: Abnormal   Collection Time: 03/22/16  3:10 PM  Result Value Ref Range   WBC 11.9 (H) 3.6 - 11.0 K/uL   RBC 4.07 3.80 - 5.20 MIL/uL   Hemoglobin 10.0 (L) 12.0 - 16.0 g/dL   HCT 30.2 (L) 35.0 - 47.0 %   MCV 74.3 (L) 80.0 - 100.0 fL   MCH 24.7 (L) 26.0 - 34.0 pg   MCHC 33.2 32.0 - 36.0 g/dL   RDW 14.7 (H) 11.5 - 14.5 %   Platelets 348 150 - 440 K/uL   Neutrophils Relative % 65% %   Neutro Abs 7.8 (H) 1.4 - 6.5 K/uL   Lymphocytes Relative 24% %   Lymphs Abs 2.9 1.0 - 3.6 K/uL   Monocytes Relative 5% %   Monocytes Absolute 0.5 0.2 - 0.9 K/uL   Eosinophils Relative 6% %   Eosinophils Absolute 0.7 0 - 0.7 K/uL   Basophils Relative 0% %   Basophils Absolute 0.0 0 - 0.1 K/uL  Lipase, blood     Status: None   Collection Time: 03/22/16  3:10 PM  Result Value Ref Range   Lipase 18 11 - 51 U/L  Urinalysis complete, with microscopic     Status: Abnormal   Collection Time: 03/22/16  4:14 PM  Result Value Ref Range   Color, Urine YELLOW (A) YELLOW   APPearance CLOUDY (A) CLEAR   Glucose, UA NEGATIVE NEGATIVE mg/dL   Bilirubin Urine NEGATIVE NEGATIVE   Ketones, ur NEGATIVE NEGATIVE mg/dL   Specific Gravity, Urine 1.020 1.005 - 1.030   Hgb urine dipstick 1+ (A) NEGATIVE   pH 8.0 5.0 - 8.0   Protein, ur NEGATIVE NEGATIVE mg/dL   Nitrite NEGATIVE NEGATIVE   Leukocytes, UA NEGATIVE NEGATIVE   RBC / HPF 0-5 0 - 5 RBC/hpf   WBC, UA 0-5 0 - 5 WBC/hpf   Bacteria, UA RARE (A) NONE SEEN   Squamous Epithelial / LPF 0-5 (A) NONE SEEN   Mucous PRESENT    Amorphous Crystal PRESENT     Dg Chest 2 View  03/22/2016  CLINICAL DATA:  45 year old female status post hysterectomy 4 days ago. Acute shortness of breath today. Initial encounter. EXAM: CHEST  2 VIEW COMPARISON:  CT Abdomen and Pelvis  03/28/2015. FINDINGS: Normal lung volumes. Normal cardiac size and mediastinal contours. Visualized tracheal air column is within normal limits. No pneumothorax or pleural effusion. No pulmonary edema or confluent pulmonary opacity. No acute osseous abnormality identified. No pneumoperitoneum. Negative visible bowel gas pattern. IMPRESSION: Negative, no acute cardiopulmonary abnormality. Electronically Signed   By: Genevie Ann M.D.   On: 03/22/2016 16:47    Assessment/Plan: 44yo G2P2 POD #4 from a  TVH/BS with malaise and difficulties taking a deep breath. Normal vitals signs, benign exam, normal labs (wbc down trending from hospital admission). No evidence of DVT or PE on exam. No evidence of intraperitoneal bleeding. Reassured patient that the tingling can be due to her positioning during the surgery or due to the straining of her bowel movement yesterday. Reassurance given. Patient will monitor her symptoms and call back if symptoms worsen. Stable for d/c. Low suspicion for PE thus no CT angio ordered. If symptoms persist or worsen, will pursue.   I spent approximately 15 minutes with the patient.  Lorette Ang 03/22/2016

## 2016-04-08 DIAGNOSIS — D6489 Other specified anemias: Secondary | ICD-10-CM | POA: Diagnosis not present

## 2016-04-08 DIAGNOSIS — J3089 Other allergic rhinitis: Secondary | ICD-10-CM | POA: Diagnosis not present

## 2016-04-08 DIAGNOSIS — I351 Nonrheumatic aortic (valve) insufficiency: Secondary | ICD-10-CM | POA: Diagnosis not present

## 2016-04-08 DIAGNOSIS — Z7689 Persons encountering health services in other specified circumstances: Secondary | ICD-10-CM | POA: Diagnosis not present

## 2016-04-19 DIAGNOSIS — I351 Nonrheumatic aortic (valve) insufficiency: Secondary | ICD-10-CM | POA: Diagnosis not present

## 2016-04-19 DIAGNOSIS — Z7689 Persons encountering health services in other specified circumstances: Secondary | ICD-10-CM | POA: Diagnosis not present

## 2016-04-19 DIAGNOSIS — D6489 Other specified anemias: Secondary | ICD-10-CM | POA: Diagnosis not present

## 2016-04-19 DIAGNOSIS — J3089 Other allergic rhinitis: Secondary | ICD-10-CM | POA: Diagnosis not present

## 2016-06-14 ENCOUNTER — Ambulatory Visit: Payer: Self-pay | Admitting: Physician Assistant

## 2016-06-14 ENCOUNTER — Encounter: Payer: Self-pay | Admitting: Physician Assistant

## 2016-06-14 VITALS — BP 130/70 | HR 76 | Temp 98.7°F

## 2016-06-14 DIAGNOSIS — H1033 Unspecified acute conjunctivitis, bilateral: Secondary | ICD-10-CM

## 2016-06-14 MED ORDER — TOBRAMYCIN 0.3 % OP SOLN: 2.0000 [drp] | OPHTHALMIC | 0 refills | Status: DC

## 2016-06-14 NOTE — Progress Notes (Signed)
S: c/o both eyes being red and matted, eyes feel gooey, sx for 2 days, no fever/chills, no cp/sob  O: vitals wnl, nad, perrl eomi, conjunctiva pink, no matting noted, neck supple no lymph, lungs c t a, cv rrr grade II-III/VI murmur at aortic valve  A: acute conjunctivitis  P: tobramycin, pt followed by DR Nehemiah Massed for murmur

## 2016-09-25 DIAGNOSIS — R300 Dysuria: Secondary | ICD-10-CM | POA: Diagnosis not present

## 2016-09-27 DIAGNOSIS — N898 Other specified noninflammatory disorders of vagina: Secondary | ICD-10-CM | POA: Diagnosis not present

## 2016-09-27 DIAGNOSIS — R300 Dysuria: Secondary | ICD-10-CM | POA: Diagnosis not present

## 2016-11-01 ENCOUNTER — Other Ambulatory Visit: Payer: Self-pay | Admitting: Unknown Physician Specialty

## 2016-11-01 ENCOUNTER — Ambulatory Visit
Admission: RE | Admit: 2016-11-01 | Discharge: 2016-11-01 | Disposition: A | Discharge status: Home / Self Care | Payer: 59 | Source: Ambulatory Visit | Attending: Unknown Physician Specialty | Admitting: Unknown Physician Specialty

## 2016-11-01 DIAGNOSIS — R51 Headache: Principal | ICD-10-CM

## 2016-11-01 DIAGNOSIS — J329 Chronic sinusitis, unspecified: Secondary | ICD-10-CM | POA: Diagnosis not present

## 2016-11-01 DIAGNOSIS — R519 Headache, unspecified: Secondary | ICD-10-CM

## 2016-12-31 DIAGNOSIS — R109 Unspecified abdominal pain: Secondary | ICD-10-CM | POA: Diagnosis not present

## 2016-12-31 DIAGNOSIS — D509 Iron deficiency anemia, unspecified: Secondary | ICD-10-CM | POA: Diagnosis not present

## 2016-12-31 DIAGNOSIS — I351 Nonrheumatic aortic (valve) insufficiency: Secondary | ICD-10-CM | POA: Diagnosis not present

## 2016-12-31 DIAGNOSIS — E538 Deficiency of other specified B group vitamins: Secondary | ICD-10-CM | POA: Diagnosis not present

## 2016-12-31 DIAGNOSIS — K582 Mixed irritable bowel syndrome: Secondary | ICD-10-CM | POA: Diagnosis not present

## 2017-02-06 ENCOUNTER — Ambulatory Visit: Payer: Self-pay | Admitting: Physician Assistant

## 2017-02-06 ENCOUNTER — Encounter: Payer: Self-pay | Admitting: Physician Assistant

## 2017-02-06 VITALS — BP 118/82 | HR 82 | Temp 98.7°F

## 2017-02-06 DIAGNOSIS — J0100 Acute maxillary sinusitis, unspecified: Secondary | ICD-10-CM

## 2017-02-06 MED ORDER — AMOXICILLIN 875 MG PO TABS
875.0000 mg | ORAL_TABLET | Freq: Two times a day (BID) | ORAL | 0 refills | Status: DC
Start: 2017-02-06 — End: 2021-03-29

## 2017-02-06 MED ORDER — PREDNISONE 10 MG PO TABS: 30.0000 mg | ORAL_TABLET | Freq: Every day | ORAL | 0 refills | Status: AC

## 2017-02-06 NOTE — Progress Notes (Signed)
S: C/o runny nose and congestion for 3 days, no fever, chills, cp/sob, v/d; mucus is yellow and thick, cough is sporadic, c/o of facial and dental pain. Pt has sinus polyps and the mucus will get stuck in the cavity  Using otc meds:   O: PE: vitals wnl, nad, perrl eomi, normocephalic, tms dull, nasal mucosa red and swollen, throat injected, neck supple no lymph, lungs c t a, cv rrr, neuro intact  A:  Acute sinusitis   P: drink fluids, continue regular meds , use otc meds of choice, return if not improving in 5 days, return earlier if worsening , amoxil , pred 30mg  qd x 3d

## 2017-04-07 DIAGNOSIS — R197 Diarrhea, unspecified: Secondary | ICD-10-CM | POA: Diagnosis not present

## 2017-04-07 DIAGNOSIS — K5900 Constipation, unspecified: Secondary | ICD-10-CM | POA: Diagnosis not present

## 2017-04-07 DIAGNOSIS — Z8371 Family history of colonic polyps: Secondary | ICD-10-CM | POA: Diagnosis not present

## 2017-04-07 DIAGNOSIS — R1031 Right lower quadrant pain: Secondary | ICD-10-CM | POA: Diagnosis not present

## 2017-04-29 IMAGING — MG MM SCREENING BREAST TOMO BILATERAL
8 of 12 series · 8 of 28 positions shown · non-contrast
Comparison: Previous exam(s).

CLINICAL DATA: Screening.

EXAM:
DIGITAL SCREENING BILATERAL MAMMOGRAM WITH 3D TOMO WITH CAD

[R MLO]
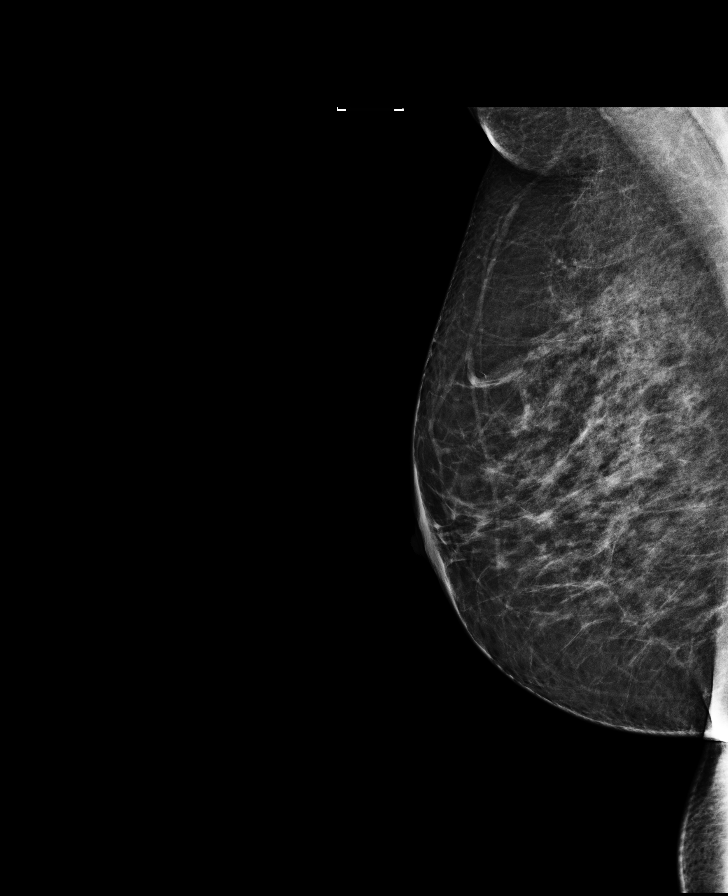

[L MLO]
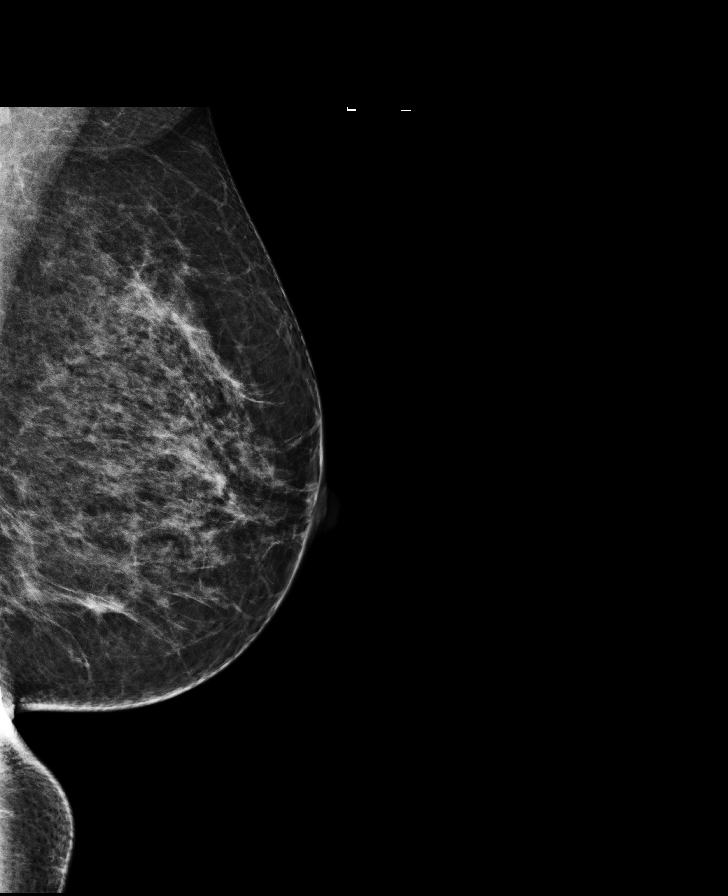

[R CC synth-2D]
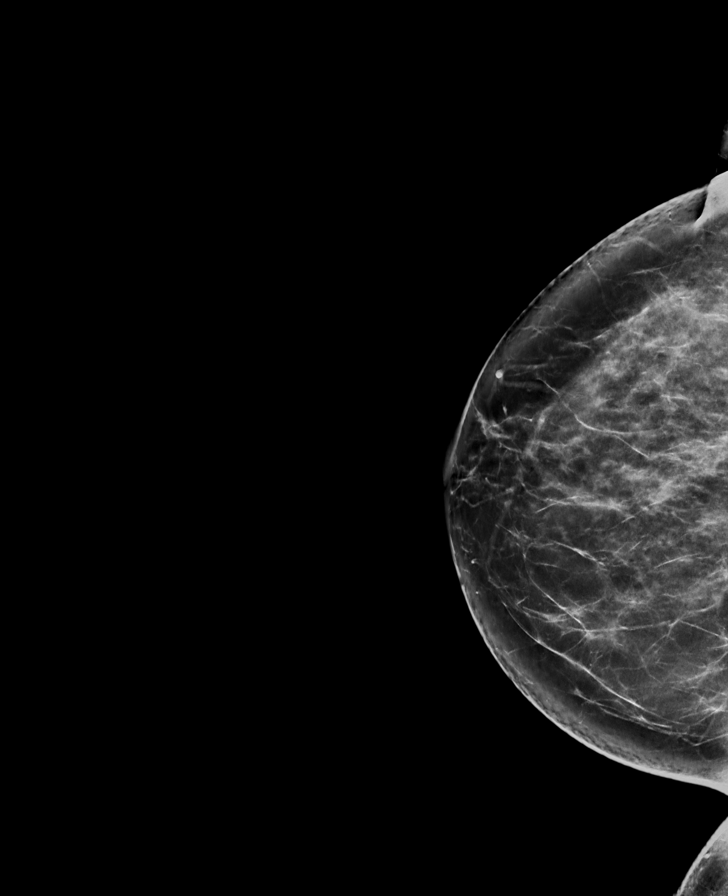

[L CC]
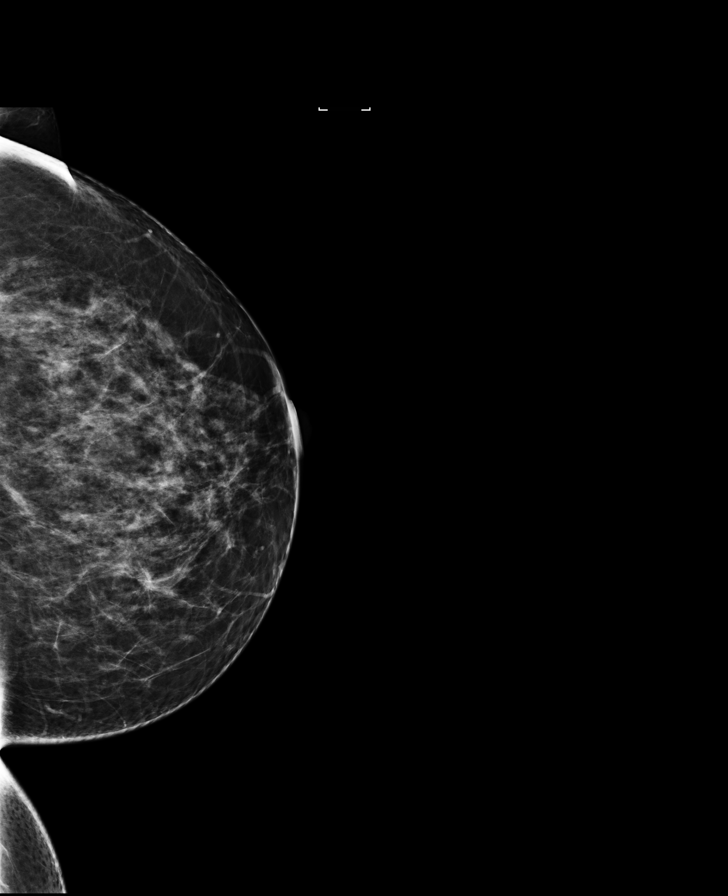

[L MLO synth-2D]
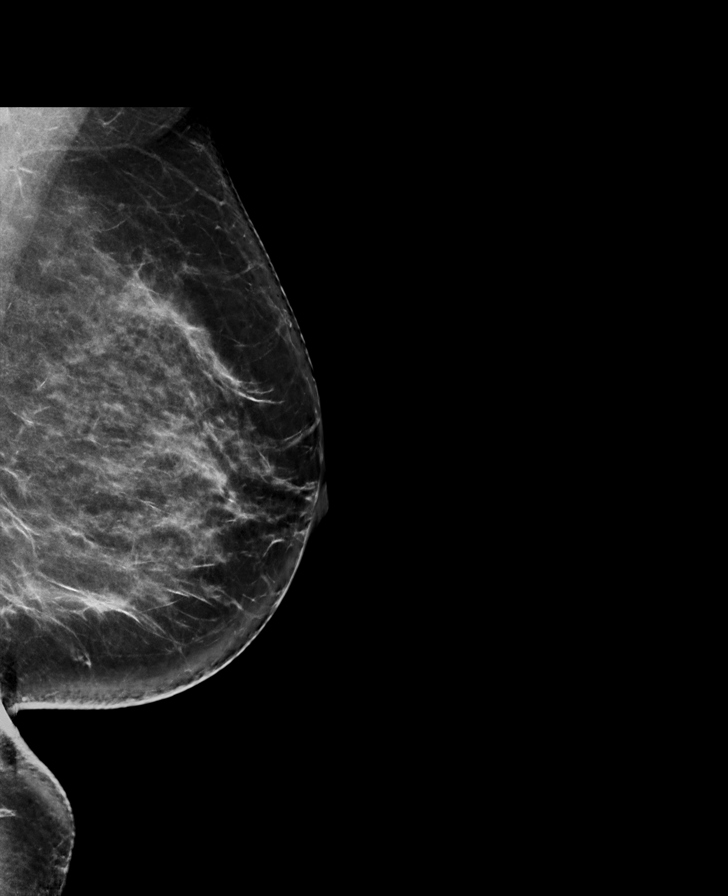

[R MLO synth-2D]
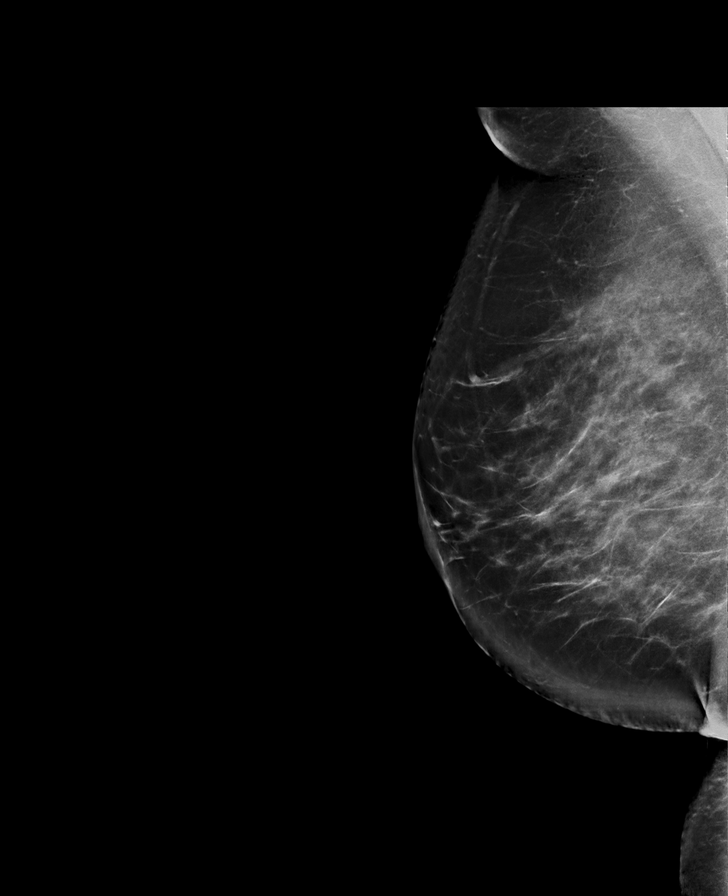

[R CC]
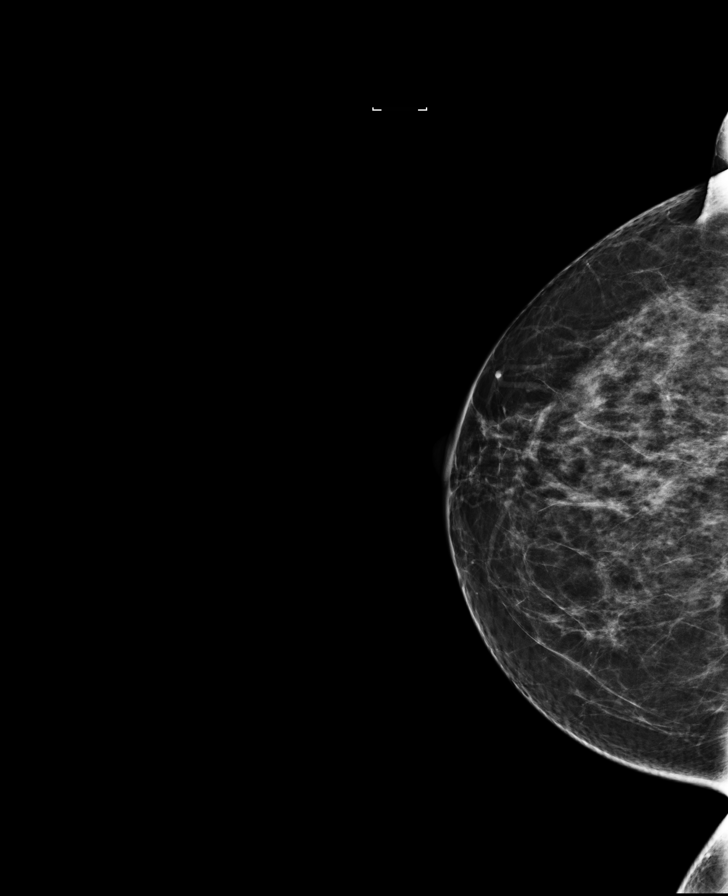

[L CC synth-2D]
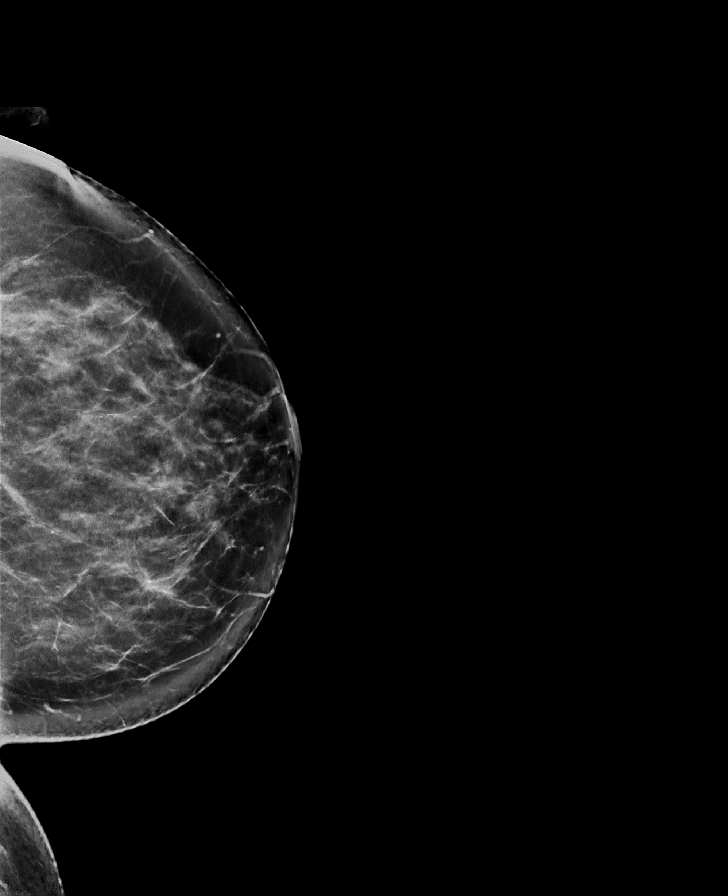

[8 of 28 positions shown; findings below may reference images not displayed]

ACR Breast Density Category c: The breast tissue is heterogeneously
dense, which may obscure small masses.
FINDINGS: There are no findings suspicious for malignancy. Images were
processed with CAD.
IMPRESSION: No mammographic evidence of malignancy. A result letter of this
screening mammogram will be mailed directly to the patient.

RECOMMENDATION:
Screening mammogram in one year. (Code:OA-G-1SS)

BI-RADS CATEGORY  1: Negative.

## 2017-08-18 ENCOUNTER — Encounter: Admission: RE | Payer: Self-pay | Source: Ambulatory Visit

## 2017-08-18 ENCOUNTER — Ambulatory Visit: Admission: RE | Admit: 2017-08-18 | Payer: 59 | Source: Ambulatory Visit | Admitting: Gastroenterology

## 2017-08-18 SURGERY — COLONOSCOPY WITH PROPOFOL
Anesthesia: General

## 2017-09-28 ENCOUNTER — Emergency Department: Payer: 59

## 2017-09-28 ENCOUNTER — Emergency Department
Admission: EM | Admit: 2017-09-28 | Discharge: 2017-09-28 | Disposition: A | Discharge status: Home / Self Care | Payer: 59 | Attending: Emergency Medicine | Admitting: Emergency Medicine

## 2017-09-28 ENCOUNTER — Encounter: Payer: Self-pay | Admitting: Emergency Medicine

## 2017-09-28 ENCOUNTER — Other Ambulatory Visit: Payer: Self-pay

## 2017-09-28 DIAGNOSIS — R079 Chest pain, unspecified: Secondary | ICD-10-CM | POA: Diagnosis not present

## 2017-09-28 DIAGNOSIS — Z79899 Other long term (current) drug therapy: Secondary | ICD-10-CM | POA: Diagnosis not present

## 2017-09-28 DIAGNOSIS — R1310 Dysphagia, unspecified: Secondary | ICD-10-CM | POA: Diagnosis not present

## 2017-09-28 DIAGNOSIS — R1319 Other dysphagia: Secondary | ICD-10-CM

## 2017-09-28 MED ORDER — FAMOTIDINE 20 MG PO TABS: 20.0000 mg | ORAL_TABLET | Freq: Two times a day (BID) | ORAL | 0 refills | Status: AC

## 2017-09-28 MED ORDER — FAMOTIDINE 20 MG PO TABS
20.0000 mg | ORAL_TABLET | Freq: Once | ORAL | Status: AC
  Administered 2017-09-28: 20 mg via ORAL
  Filled 2017-09-28: qty 1

## 2017-09-28 NOTE — ED Notes (Signed)

## 2017-09-28 NOTE — ED Notes (Signed)
Pt reports pain in her esophagus and chest when she swallows. Pt states that the pain started yesterday morning. Pt denies any other symptoms. Pt states that she does not have the pain any other time. Pt states that the pain last between 5-10 seconds and then it goes away.  Pt states that it does not matter if what she eats/drinks is cold or hot, she has the same amount of pain.  Pt states that she tried OTC antiacids last night while at work and it did not help. Pt states that she has not been burping more than normal. Pt denies hx/o acid reflux.

## 2017-09-28 NOTE — ED Notes (Signed)
Triage note completed in error under Beverely Risen, EDT. This RN completed triage note.

## 2017-09-28 NOTE — ED Notes (Signed)
Pt walked to XR.

## 2017-09-28 NOTE — Discharge Instructions (Signed)
Take the Pepcid as prescribed until you follow-up with eye doctor.  Return to the emergency department for any new, worsening, or persistent esophageal or chest pain, difficulty breathing, vomiting, severe nausea, regurgitation after swallowing, inability to swallow, or any other new or worsening symptoms that concern you.  Follow-up with your primary care as well as with a gastroenterologist.

## 2017-09-28 NOTE — ED Provider Notes (Signed)
[facility] Emergency Department Provider Note ____________________________________________   First MD Initiated Contact with Patient 09/28/17 1813     (approximate)  I have reviewed the triage vital signs and the nursing notes.   HISTORY  Chief Complaint Painful swallowing    HPI Laura Schmitt is a 46 y.o. female with past medical history as noted below who presents with chest pain, located to the lower part of her sternal area where she feels the end of her esophagus is, occurring after she swallows, lasting approximately 5-10 seconds and then resolving.  The pain has been happening for the last 2 days.  She states it started after she was eating and she felt as if the food was going down the esophagus and then got stuck.  She states that she did not eat any large food boluses.  She states that since this initial time she has had a brief episode of this pain after every time she eats, however she is able to eat liquids and solids.  She has not vomited or regurgitated any of her food.  The patient denies associated weakness or lightheadedness, cough, difficulty breathing, leg pain or swelling, or fever.  No prior history of this symptom.  No history of any esophageal or upper GI problems.  She states she took a calcium carbonate pill but it did not help.  Past Medical History:  Diagnosis Date  . Heart murmur    aortic valve     Patient Active Problem List   Diagnosis Date Noted  . Post-operative state 03/18/2016  . Moderate aortic valve insufficiency 02/15/2016    Past Surgical History:  Procedure Laterality Date  . BILATERAL SALPINGECTOMY Bilateral 03/18/2016   Procedure: BILATERAL SALPINGECTOMY;  Surgeon: Boykin Nearing, MD;  Location: [facility] ORS;  Service: Gynecology;  Laterality: Bilateral;  . Lt ear cyst    . SINUS EXPLORATION    . VAGINAL HYSTERECTOMY N/A 03/18/2016   Procedure: HYSTERECTOMY VAGINAL;  Surgeon: Boykin Nearing, MD;   Location: [facility] ORS;  Service: Gynecology;  Laterality: N/A;    Prior to Admission medications   Medication Sig Start Date End Date Taking? Authorizing Provider  amoxicillin (AMOXIL) 875 MG tablet Take 1 tablet (875 mg total) by mouth 2 (two) times daily. 02/06/17   Fisher, Linden Dolin, PA-C  cetirizine (ZYRTEC) 10 MG tablet Take 10 mg by mouth daily.    [provider]  famotidine (PEPCID) 20 MG tablet Take 1 tablet (20 mg total) by mouth 2 (two) times daily. 09/28/17   Arta Silence, MD  ibuprofen (ADVIL,MOTRIN) 800 MG tablet Take 1 tablet (800 mg total) by mouth every 8 (eight) hours as needed (mild pain). 03/19/16   Schermerhorn, Gwen Her, MD  montelukast (SINGULAIR) 10 MG tablet Take 1 tablet (10 mg total) by mouth at bedtime. 11/01/15   Caryn Section Linden Dolin, PA-C  predniSONE (DELTASONE) 10 MG tablet Take 3 tablets (30 mg total) by mouth daily with breakfast. 02/06/17   Caryn Section Linden Dolin, PA-C    Allergies Sulfa antibiotics  No family history on file.  Social History Social History   Tobacco Use  . Smoking status: Never Smoker  . Smokeless tobacco: Never Used  Substance Use Topics  . Alcohol use: No  . Drug use: No    Review of Systems  Constitutional: No fever/chills.  Eyes: No redness. ENT: No throat pain. Cardiovascular: Positive for lower chest pain. Respiratory: Denies shortness of breath. Gastrointestinal: No nausea, no vomiting.  No diarrhea.  Genitourinary: Negative for flank pain.  Musculoskeletal: Negative for back pain. Skin: Negative for rash. Neurological: Negative for headache.   ____________________________________________   PHYSICAL EXAM:  VITAL SIGNS: ED Triage Vitals  Enc Vitals Group     BP 09/28/17 1749 134/61     Pulse Rate 09/28/17 1749 81     Resp --      Temp 09/28/17 1749 98.7 F (37.1 C)     Temp Source 09/28/17 1749 Oral     SpO2 09/28/17 1749 98 %     Weight 09/28/17 1750 174 lb (78.9 kg)     Height 09/28/17 1750 5\' 4"  (1.626  m)     Head Circumference --      Peak Flow --      Pain Score 09/28/17 1806 0     Pain Loc --      Pain Edu? --      Excl. in [place]? --     Constitutional: Alert and oriented. Well appearing and in no acute distress. Eyes: Conjunctivae are normal.  Head: Atraumatic. Nose: No congestion/rhinnorhea. Mouth/Throat: Mucous membranes are moist.  Oropharynx clear with no erythema or swelling. Neck: Normal range of motion.  No swelling. Cardiovascular: Normal rate, regular rhythm. Grossly normal heart sounds.  Good peripheral circulation.  No chest wall tenderness. Respiratory: Normal respiratory effort.  No retractions. Lungs CTAB. Gastrointestinal: No distention.  Musculoskeletal:  Extremities warm and well perfused.  Neurologic:  Normal speech and language. No gross focal neurologic deficits are appreciated.  Skin:  Skin is warm and dry. No rash noted. Psychiatric: Mood and affect are normal. Speech and behavior are normal.  ____________________________________________   LABS (all labs ordered are listed, but only abnormal results are displayed)  Labs Reviewed - No data to display ____________________________________________  EKG  ED ECG REPORT I, Arta Silence, the attending physician, personally viewed and interpreted this ECG.  Date: 09/28/2017 EKG Time: 1840 Rate: 80 Rhythm: normal sinus rhythm QRS Axis: normal Intervals: normal ST/T Wave abnormalities: normal Narrative Interpretation: no evidence of acute ischemia  ____________________________________________  RADIOLOGY  CXR: No focal infiltrate, mediastinal air, or other acute findings.  ____________________________________________   PROCEDURES  Procedure(s) performed: No    Critical Care performed: No ____________________________________________   INITIAL IMPRESSION / ASSESSMENT AND PLAN / ED COURSE  Pertinent labs & imaging results that were available during my care of the patient were reviewed  by me and considered in my medical decision making (see chart for details).  46 year old female with past medical history as noted above presents with intermittent episodes of lower chest pain after eating for the last 2 days.  The symptoms occurred after meal and have persisted every time she tries to eat or drink.  She reports pain lasting 5-10 seconds after each time she swallows something.  She has no pain in between.  There are no significant associated symptoms.  On exam, the patient is extremely well-appearing, the vital signs are normal, and the remainder the exam is unremarkable.  The lungs are clear, chest wall is nontender, and the oropharynx is clear as well.  Past medical records reviewed in epic and are noncontributory.  Differential for this symptom includes reflux, mild esophagitis, or other esophageal causes such as spasm or stricture.  There is no evidence of impacted food bolus or severe stricture given the patient has been able to eat and drink normally without regurgitation or other symptoms besides the pain.  Given the brief episodes of pain and association with  food, this is not consistent with ACS, PE, aortic dissection, or other cardiac or vascular cause.  Patient's EKG is normal.  Plan: Chest x-ray, EKG, and trial of H2 blocker.  If the chest x-ray is negative, I will plan to discharge home with continued antacid medication and GI referral.    ----------------------------------------- 7:18 PM on 09/28/2017 -----------------------------------------  Chest x-ray is negative.  The patient feels well, and is comfortable with going home.  Return precautions given and the patient expressed understanding.  I also explained the discharge instructions and plan for follow-up, and she agrees with this plan.  ____________________________________________   FINAL CLINICAL IMPRESSION(S) / ED DIAGNOSES  Final diagnoses:  Esophageal dysphagia      NEW MEDICATIONS STARTED DURING  THIS VISIT:  This SmartLink is deprecated. Use AVSMEDLIST instead to display the medication list for a patient.   Note:  This document was prepared using Dragon voice recognition software and may include unintentional dictation errors.    Arta Silence, MD 09/28/17 [id number]

## 2017-09-28 NOTE — ED Triage Notes (Signed)
Pt reports pain upon swallowing and pain when she takes a deep breath. Pt reports feeling the pain in her esophagus. Denies NVD. Denies fever. Denies any other symptoms other than the pain upon swallowing. Ambulatory to triage. No apparent distress noted in triage.

## 2017-10-22 ENCOUNTER — Encounter: Payer: Self-pay | Admitting: *Deleted

## 2017-10-23 ENCOUNTER — Ambulatory Visit
Admission: RE | Admit: 2017-10-23 | Discharge: 2017-10-23 | Disposition: A | Discharge status: Home / Self Care | Payer: No Typology Code available for payment source | Source: Ambulatory Visit | Attending: Gastroenterology | Admitting: Gastroenterology

## 2017-10-23 ENCOUNTER — Encounter
Admission: RE | Disposition: A | Discharge status: Home / Self Care | Payer: Self-pay | Source: Ambulatory Visit | Attending: Gastroenterology

## 2017-10-23 ENCOUNTER — Ambulatory Visit: Payer: No Typology Code available for payment source | Admitting: Certified Registered Nurse Anesthetist

## 2017-10-23 ENCOUNTER — Encounter: Payer: Self-pay | Admitting: Certified Registered Nurse Anesthetist

## 2017-10-23 DIAGNOSIS — I351 Nonrheumatic aortic (valve) insufficiency: Secondary | ICD-10-CM | POA: Insufficient documentation

## 2017-10-23 DIAGNOSIS — Z79899 Other long term (current) drug therapy: Secondary | ICD-10-CM | POA: Diagnosis not present

## 2017-10-23 DIAGNOSIS — Z8371 Family history of colonic polyps: Secondary | ICD-10-CM | POA: Insufficient documentation

## 2017-10-23 DIAGNOSIS — Z882 Allergy status to sulfonamides status: Secondary | ICD-10-CM | POA: Insufficient documentation

## 2017-10-23 DIAGNOSIS — I358 Other nonrheumatic aortic valve disorders: Secondary | ICD-10-CM | POA: Diagnosis not present

## 2017-10-23 DIAGNOSIS — Q438 Other specified congenital malformations of intestine: Secondary | ICD-10-CM | POA: Insufficient documentation

## 2017-10-23 DIAGNOSIS — Z1211 Encounter for screening for malignant neoplasm of colon: Secondary | ICD-10-CM | POA: Diagnosis present

## 2017-10-23 DIAGNOSIS — Z538 Procedure and treatment not carried out for other reasons: Secondary | ICD-10-CM | POA: Diagnosis not present

## 2017-10-23 HISTORY — DX: Nonrheumatic aortic (valve) insufficiency: I35.1

## 2017-10-23 HISTORY — DX: Anemia, unspecified: D64.9

## 2017-10-23 HISTORY — PX: COLONOSCOPY WITH PROPOFOL: SHX5780

## 2017-10-23 HISTORY — DX: Perianal venous thrombosis: K64.5

## 2017-10-23 HISTORY — DX: Dysplasia of cervix uteri, unspecified: N87.9

## 2017-10-23 SURGERY — COLONOSCOPY WITH PROPOFOL
Anesthesia: General

## 2017-10-23 MED ORDER — LIDOCAINE HCL (PF) 1 % IJ SOLN
INTRAMUSCULAR | Status: AC
  Administered 2017-10-23: 0.3 mL
  Filled 2017-10-23: qty 2

## 2017-10-23 MED ORDER — LIDOCAINE HCL (PF) 1 % IJ SOLN: 2.0000 mL | Freq: Once | INTRAMUSCULAR | Status: DC

## 2017-10-23 MED ORDER — LIDOCAINE HCL (PF) 2 % IJ SOLN
INTRAMUSCULAR | Status: AC
  Filled 2017-10-23: qty 10

## 2017-10-23 MED ORDER — PROPOFOL 500 MG/50ML IV EMUL
INTRAVENOUS | Status: DC | PRN
  Administered 2017-10-23: 140 ug/kg/min via INTRAVENOUS

## 2017-10-23 MED ORDER — PROPOFOL 500 MG/50ML IV EMUL
INTRAVENOUS | Status: AC
  Filled 2017-10-23: qty 50

## 2017-10-23 MED ORDER — SODIUM CHLORIDE 0.9 % IV SOLN
INTRAVENOUS | Status: DC
  Administered 2017-10-23: 1000 mL via INTRAVENOUS

## 2017-10-23 MED ORDER — LIDOCAINE HCL (CARDIAC) 20 MG/ML IV SOLN
INTRAVENOUS | Status: DC | PRN
  Administered 2017-10-23: 50 mg via INTRAVENOUS

## 2017-10-23 MED ORDER — SODIUM CHLORIDE 0.9 % IV SOLN: INTRAVENOUS | Status: DC

## 2017-10-23 MED ORDER — PROPOFOL 10 MG/ML IV BOLUS
INTRAVENOUS | Status: DC | PRN
  Administered 2017-10-23: 100 mg via INTRAVENOUS

## 2017-10-23 NOTE — Anesthesia Post-op Follow-up Note (Signed)
Anesthesia QCDR form completed.        

## 2017-10-23 NOTE — H&P (Signed)
Outpatient short stay form Pre-procedure 10/23/2017 1:36 PM Lollie Sails MD  Primary Physician: Dr Tracie Harrier  Reason for visit:  Colonoscopy  History of present illness:  Patient is a 47 year old female presenting today as above. There is family history of colon polyps and a primary relative. She also has a history of alternating bowel habits likely IBS. He is not currently on any treatment for that issue. She tolerated her prep well. She takes no aspirin or blood thinning agents.    Current Facility-Administered Medications:  .  0.9 %  sodium chloride infusion, , Intravenous, Continuous, Lollie Sails, MD, Last Rate: 20 mL/hr at 10/23/17 1316, 1,000 mL at 10/23/17 1316 .  0.9 %  sodium chloride infusion, , Intravenous, Continuous, Lollie Sails, MD .  lidocaine (PF) (XYLOCAINE) 1 % injection 2 mL, 2 mL, Intradermal, Once, Lollie Sails, MD  Facility-Administered Medications Ordered in Other Encounters:  .  lidocaine (cardiac) 100 mg/26ml (XYLOCAINE) 20 MG/ML injection 2%, , , Anesthesia Intra-op, Eben Burow, CRNA, 50 mg at 10/23/17 1327  Medications Prior to Admission  Medication Sig Dispense Refill Last Dose  . Cyanocobalamin (VITAMIN B 12 PO) Take 1,000 mcg by mouth daily.     . diphenhydrAMINE (BENADRYL) 25 MG tablet Take 25 mg by mouth every 6 (six) hours as needed.     . ferrous sulfate 325 (65 FE) MG tablet Take 325 mg by mouth daily with breakfast.     . fluticasone (FLONASE) 50 MCG/ACT nasal spray Place 2 sprays into both nostrils daily.     Marland Kitchen amoxicillin (AMOXIL) 875 MG tablet Take 1 tablet (875 mg total) by mouth 2 (two) times daily. 20 tablet 0   . cetirizine (ZYRTEC) 10 MG tablet Take 10 mg by mouth daily.   10/23/2017 at 2230  . famotidine (PEPCID) 20 MG tablet Take 1 tablet (20 mg total) by mouth 2 (two) times daily. 60 tablet 0   . ibuprofen (ADVIL,MOTRIN) 800 MG tablet Take 1 tablet (800 mg total) by mouth every 8 (eight) hours as needed (mild  pain). 60 tablet 0 Taking  . montelukast (SINGULAIR) 10 MG tablet Take 1 tablet (10 mg total) by mouth at bedtime. 30 tablet 3 2230  . predniSONE (DELTASONE) 10 MG tablet Take 3 tablets (30 mg total) by mouth daily with breakfast. 9 tablet 0      Allergies  Allergen Reactions  . Sulfa Antibiotics Rash     Past Medical History:  Diagnosis Date  . Anemia   . Cervical dysplasia   . Heart murmur    aortic valve   . Hemorrhoids, external, thrombosed   . Moderate aortic insufficiency     Review of systems:      Physical Exam    Heart and lungs: Regular rate and rhythm without rub or gallop, lungs are bilaterally clear.    HEENT: Normocephalic atraumatic eyes are anicteric    Other:     Pertinant exam for procedure: Soft nontender nondistended bowel sounds positive normoactive    Planned proceedures: Colonoscopy and indicated procedures.    Lollie Sails, MD Gastroenterology 10/23/2017  1:36 PM

## 2017-10-23 NOTE — Op Note (Signed)
[facility] Gastroenterology Patient Name: Laura Schmitt Procedure Date: 10/23/2017 1:25 PM MRN: [id number] Account #: [id number] Date of Birth: [birth year] Admit Type: Outpatient Age: 47 Room: [facility] ENDO ROOM 4 Gender: Female Note Status: Finalized Procedure:            Colonoscopy Indications:          Family history of colonic polyps in a first-degree                        relative Providers:            Lollie Sails, MD Referring MD:         Tracie Harrier, MD (Referring MD) Medicines:            Monitored Anesthesia Care Complications:        No immediate complications. Procedure:            Pre-Anesthesia Assessment:                       - ASA Grade Assessment: III - A patient with severe                        systemic disease.                       After obtaining informed consent, the colonoscope was                        passed under direct vision. Throughout the procedure,                        the patient's blood pressure, pulse, and oxygen                        saturations were monitored continuously. The                        Colonoscope was introduced through the anus with the                        intention of advancing to the cecum. The scope was                        advanced to the sigmoid colon before the procedure was                        aborted. Medications were given. The colonoscopy was                        unusually difficult due to restricted mobility of the                        colon and a tortuous colon. Findings:      the scope was carefully advanced to about 30 cm marking through several       turns. Once at this location, I was unable to pass the scope further due       to a stiffness/possible tethering on the colon. I changed to a more       flexible scope and used position change without success. Mild/minimal       mucosal  disruption/splitting noted at a turn without other effect.      The digital rectal exam  was normal. Impression:           - No specimens collected. Recommendation:       - Perform a virtual colonoscopy at appointment to be                        scheduled. Procedure Code(s):    --- Professional ---                       [phone], 48, Colonoscopy, flexible; diagnostic, including                        collection of specimen(s) by brushing or washing, when                        performed (separate procedure) Diagnosis Code(s):    --- Professional ---                       Z83.71, Family history of colonic polyps CPT copyright 2016 American Medical [employer]. All rights reserved. The codes documented in this report are preliminary and upon coder review may  be revised to meet current compliance requirements. Lollie Sails, MD 10/23/2017 2:11:10 PM This report has been signed electronically. Number of Addenda: 0 Note Initiated On: 10/23/2017 1:25 PM Total Procedure Duration: 0 hours 20 minutes 0 seconds       [facility]

## 2017-10-23 NOTE — Anesthesia Preprocedure Evaluation (Signed)
Anesthesia Evaluation  Patient identified by MRN, date of birth, ID band Patient awake    Reviewed: Allergy & Precautions, H&P , NPO status , Patient's Chart, lab work & pertinent test results  Airway Mallampati: III  TM Distance: <3 FB Neck ROM: full    Dental  (+) Chipped   Pulmonary neg pulmonary ROS, neg shortness of breath,           Cardiovascular Exercise Tolerance: Good (-) angina(-) DOE + Valvular Problems/Murmurs AI      Neuro/Psych negative neurological ROS  negative psych ROS   GI/Hepatic negative GI ROS, Neg liver ROS, neg GERD  ,  Endo/Other  negative endocrine ROS  Renal/GU negative Renal ROS  negative genitourinary   Musculoskeletal   Abdominal   Peds  Hematology negative hematology ROS (+)   Anesthesia Other Findings Past Medical History: No date: Anemia No date: Cervical dysplasia No date: Heart murmur     Comment:  aortic valve  No date: Hemorrhoids, external, thrombosed No date: Moderate aortic insufficiency  Past Surgical History: No date: ABDOMINAL HYSTERECTOMY 03/18/2016: BILATERAL SALPINGECTOMY; Bilateral     Comment:  Procedure: BILATERAL SALPINGECTOMY;  Surgeon: Boykin Nearing, MD;  Location: [facility] ORS;  Service:               Gynecology;  Laterality: Bilateral; 01/05/2008: I&D THROMBOSED HEMORRHOID 08/14/2010: LEEP No date: Lt ear cyst No date: SINUS EXPLORATION 03/18/2016: VAGINAL HYSTERECTOMY; N/A     Comment:  Procedure: HYSTERECTOMY VAGINAL;  Surgeon: Boykin Nearing, MD;  Location: [facility] ORS;  Service:               Gynecology;  Laterality: N/A;  BMI    Body Mass Index:  29.35 kg/m      Reproductive/Obstetrics negative OB ROS                             Anesthesia Physical Anesthesia Plan  ASA: III  Anesthesia Plan: General   Post-op Pain Management:    Induction: Intravenous  PONV Risk Score and  Plan: Propofol infusion  Airway Management Planned: Natural Airway and Nasal Cannula  Additional Equipment:   Intra-op Plan:   Post-operative Plan:   Informed Consent: I have reviewed the patients History and Physical, chart, labs and discussed the procedure including the risks, benefits and alternatives for the proposed anesthesia with the patient or authorized representative who has indicated his/her understanding and acceptance.   Dental Advisory Given  Plan Discussed with: Anesthesiologist, CRNA and Surgeon  Anesthesia Plan Comments: (Patient consented for risks of anesthesia including but not limited to:  - adverse reactions to medications - risk of intubation if required - damage to teeth, lips or other oral mucosa - sore throat or hoarseness - Damage to heart, brain, lungs or loss of life  Patient voiced understanding.)        Anesthesia Quick Evaluation

## 2017-10-23 NOTE — Transfer of Care (Signed)
Immediate Anesthesia Transfer of Care Note  Patient: Laura Schmitt  Procedure(s) Performed: COLONOSCOPY WITH PROPOFOL (N/A )  Patient Location: PACU and Endoscopy Unit  Anesthesia Type:General  Level of Consciousness: awake, drowsy and patient cooperative  Airway & Oxygen Therapy: Patient Spontanous Breathing and Patient connected to nasal cannula oxygen  Post-op Assessment: Report given to RN and Post -op Vital signs reviewed and stable  Post vital signs: Reviewed and stable  Last Vitals:  Vitals:   10/23/17 1307 10/23/17 1406  BP: 134/67 110/63  Pulse: 84 81  Resp: (!) 22 16  Temp: (!) 36.2 C 37 C  SpO2: 98% 100%    Last Pain:  Vitals:   10/23/17 1406  TempSrc:   PainSc: Asleep         Complications: No apparent anesthesia complications

## 2017-10-24 ENCOUNTER — Encounter: Payer: Self-pay | Admitting: Gastroenterology

## 2017-10-27 ENCOUNTER — Other Ambulatory Visit: Payer: Self-pay | Admitting: Gastroenterology

## 2017-10-27 DIAGNOSIS — Q438 Other specified congenital malformations of intestine: Secondary | ICD-10-CM

## 2017-10-27 DIAGNOSIS — Z8371 Family history of colonic polyps: Secondary | ICD-10-CM

## 2017-10-27 NOTE — Anesthesia Postprocedure Evaluation (Signed)
Anesthesia Post Note  Patient: JAEDA BRUSO  Procedure(s) Performed: COLONOSCOPY WITH PROPOFOL (N/A )  Patient location during evaluation: Endoscopy Anesthesia Type: General Level of consciousness: awake and alert Pain management: pain level controlled Vital Signs Assessment: post-procedure vital signs reviewed and stable Respiratory status: spontaneous breathing, nonlabored ventilation, respiratory function stable and patient connected to nasal cannula oxygen Cardiovascular status: blood pressure returned to baseline and stable Postop Assessment: no apparent nausea or vomiting Anesthetic complications: no     Last Vitals:  Vitals:   10/23/17 1416 10/23/17 1426  BP: (!) 110/57 112/60  Pulse: 66 64  Resp: 16 16  Temp:    SpO2: 100% 100%    Last Pain:  Vitals:   10/23/17 1426  TempSrc:   PainSc: 0-No pain                 Precious Haws Piscitello

## 2017-11-07 ENCOUNTER — Other Ambulatory Visit: Payer: Self-pay | Admitting: Internal Medicine

## 2017-11-07 DIAGNOSIS — Z1239 Encounter for other screening for malignant neoplasm of breast: Secondary | ICD-10-CM

## 2017-12-15 ENCOUNTER — Ambulatory Visit
Admission: RE | Admit: 2017-12-15 | Discharge: 2017-12-15 | Disposition: A | Discharge status: Home / Self Care | Payer: No Typology Code available for payment source | Source: Ambulatory Visit | Attending: Internal Medicine | Admitting: Internal Medicine

## 2017-12-15 DIAGNOSIS — Z1231 Encounter for screening mammogram for malignant neoplasm of breast: Secondary | ICD-10-CM | POA: Diagnosis not present

## 2017-12-15 DIAGNOSIS — Z1239 Encounter for other screening for malignant neoplasm of breast: Secondary | ICD-10-CM

## 2018-03-27 ENCOUNTER — Other Ambulatory Visit: Payer: Self-pay | Admitting: Internal Medicine

## 2018-03-27 DIAGNOSIS — I351 Nonrheumatic aortic (valve) insufficiency: Secondary | ICD-10-CM

## 2018-04-07 ENCOUNTER — Ambulatory Visit
Admission: RE | Admit: 2018-04-07 | Discharge: 2018-04-07 | Disposition: A | Discharge status: Home / Self Care | Payer: No Typology Code available for payment source | Source: Ambulatory Visit | Attending: Internal Medicine | Admitting: Internal Medicine

## 2018-04-07 DIAGNOSIS — I083 Combined rheumatic disorders of mitral, aortic and tricuspid valves: Secondary | ICD-10-CM | POA: Diagnosis not present

## 2018-04-07 DIAGNOSIS — I351 Nonrheumatic aortic (valve) insufficiency: Secondary | ICD-10-CM

## 2018-04-07 NOTE — Progress Notes (Signed)
*  PRELIMINARY RESULTS* Echocardiogram 2D Echocardiogram has been performed.  Laura Schmitt 04/07/2018, 10:45 AM

## 2019-03-17 ENCOUNTER — Other Ambulatory Visit: Payer: Self-pay | Admitting: Internal Medicine

## 2019-03-17 DIAGNOSIS — Z1231 Encounter for screening mammogram for malignant neoplasm of breast: Secondary | ICD-10-CM

## 2019-05-26 ENCOUNTER — Ambulatory Visit
Admission: RE | Admit: 2019-05-26 | Discharge: 2019-05-26 | Disposition: A | Discharge status: Home / Self Care | Payer: No Typology Code available for payment source | Source: Ambulatory Visit | Attending: Internal Medicine | Admitting: Internal Medicine

## 2019-05-26 DIAGNOSIS — Z1231 Encounter for screening mammogram for malignant neoplasm of breast: Secondary | ICD-10-CM | POA: Diagnosis present

## 2019-10-21 ENCOUNTER — Ambulatory Visit: Payer: No Typology Code available for payment source | Attending: Podiatry

## 2019-10-21 ENCOUNTER — Other Ambulatory Visit: Payer: Self-pay

## 2019-10-21 DIAGNOSIS — M79671 Pain in right foot: Secondary | ICD-10-CM | POA: Insufficient documentation

## 2019-10-21 DIAGNOSIS — M79672 Pain in left foot: Secondary | ICD-10-CM | POA: Diagnosis not present

## 2019-10-21 DIAGNOSIS — M25671 Stiffness of right ankle, not elsewhere classified: Secondary | ICD-10-CM | POA: Diagnosis present

## 2019-10-21 NOTE — Therapy (Signed)
[facility] [address], [place], [address] Phone: [phone]   Fax:  [phone]  Physical Therapy Evaluation  Patient Details  Name: Laura Schmitt MRN: [id number] Date of Birth: 1971-07-04 Referring Provider (PT): Samara Deist   Encounter Date: 10/21/2019  PT End of Session - 10/21/19 1539    Visit Number  1    Number of Visits  17    Date for PT Re-Evaluation  12/02/19    Authorization Type  1/10    PT Start Time  0804    PT Stop Time  0902    PT Time Calculation (min)  58 min    Activity Tolerance  Patient tolerated treatment well       Past Medical History:  Diagnosis Date  . Anemia   . Cervical dysplasia   . Heart murmur    aortic valve   . Hemorrhoids, external, thrombosed   . Moderate aortic insufficiency     Past Surgical History:  Procedure Laterality Date  . ABDOMINAL HYSTERECTOMY    . BILATERAL SALPINGECTOMY Bilateral 03/18/2016   Procedure: BILATERAL SALPINGECTOMY;  Surgeon: Boykin Nearing, MD;  Location: [facility] ORS;  Service: Gynecology;  Laterality: Bilateral;  . COLONOSCOPY WITH PROPOFOL N/A 10/23/2017   Procedure: COLONOSCOPY WITH PROPOFOL;  Surgeon: Lollie Sails, MD;  Location: [facility] ENDOSCOPY;  Service: Endoscopy;  Laterality: N/A;  . I&D THROMBOSED HEMORRHOID  01/05/2008  . LEEP  08/14/2010  . Lt ear cyst    . SINUS EXPLORATION    . VAGINAL HYSTERECTOMY N/A 03/18/2016   Procedure: HYSTERECTOMY VAGINAL;  Surgeon: Boykin Nearing, MD;  Location: [facility] ORS;  Service: Gynecology;  Laterality: N/A;    There were no vitals filed for this visit.   Subjective Assessment - 10/21/19 1547    Subjective  Patient is a pleasant 49 yo female presenting to PT with walker boot on R foot with referral for BLE plantar fasciitis. Presents to PT with her young daughter. Pt enjoys baking.    Patient is accompained by:  Family member   daughter (young)   Pertinent History   C/C BLE foot pain. 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 Attempted stretches, orthotics, frozen water bottle rolling, foot massager, hot soaks, OTC medication without relief. Currently uses ibu and Lodine which provides relief for 6 hr. Developed achilles tendonitis in R foot, cortisone shots in June and Dec 2020 with some relief, put in walker boot 09/30/2019. Current pain 0/10, increasing to 8/10 in R radiating up RLE to mid-calf, 6/10 in L, over day's work. Pain is worse with first steps. Pain increases quickly (within 10 minutes walking). Agg: standing, walking. Ease: boot, antiinflamatories. Comorbidities: pt admits to LBP at end of shift    Limitations  Walking;Standing    How long can you sit comfortably?  unlimited    How long can you stand comfortably?  10 min    How long can you walk comfortably?  10 min    Patient Stated Goals  Walk without pain    Currently in Pain?  Yes    Pain Score  4     Pain Location  Foot    Pain Orientation  Left    Pain Descriptors / Indicators  Aching;Burning    Pain Type  Chronic pain    Pain Onset  More than a month ago    Pain Frequency  Intermittent  Aggravating Factors   walking, standing    Pain Relieving Factors  boot, ibu    Multiple Pain Sites  No          PAIN Location: Calcaneal, going up posterior RLE to mid calf   0/10 current 8/10 worst 0/10 best  Location: LLE calcaneal  4/10 current 6/10 worst 0/10 best  Irritability: low Nature: MSK Stage: chronic Stability: ISQ  Agg: walking, standing Ease: boot, drugs 24-hour behavior: worse after waking, worse throughout day.  Note also LBP - relatively minor, intermittent, after night's work lifting   ADLs and MOBILITY Sit tol:  Stand tol: 10 min Walk tol: 10 min Reach:  Sleep: OK Stairs: OK Falls: OK  Red flags Wt loss: no B/B: no Night pain: no CA: no  GOALS: feet to not hurt. Walk  around zoo without feet hurting   POSTURE Standing: WNL Sitting: WNL  GAIT / MOBILITY / TRANSFERS Gait: reduced toe-off/terminal stance Stairs: WNL Squat: OK   BALANCE Deferred  PROM / AROM / MMT   HIP ROM  MMT    R L R L  Flex WNL WNL 4+ 4+  Ext. WNL WNL 4 5  ABD WNL WNL 5 5  ADD WNL WNL 5 5  ER WNL WNL 5 5  IR WNL WNL 4- 4-  Glute   5* 5*  ! = Painful  Knee Grossly 5/5 and WNL  ANKLE ROM  MMT    R L R L  DF -8 0 5 5  PF WNL WNL 4* 4  Inversion WNL WNL    Eversion WNL WNL    DF knee bent 0 10    ! = Painful * = reduced height with heel raises compared to L  SOFT TISSUE LENGTH Hamstrings 90-90: 55 R 65 L Increased tissue tension noted in BLE gastroc, soleus  PAM Midfoot: WNL STJ: mildly limited ev T-C: WNL Great toe: WNL  NEURO Sensation: intact to light touch   SPECIAL TESTS Talar tilt: - Anterior drawer: - Posterior drawer: - Windlass: weak + R/L  Examination of L footwear (sneaker): tread wear over lateral heel, lateral midfoot, met heads 1-5; no wear over toe tread  PALPATION   TTP 3+:  TTP 2+:  TTP 1+: R/L medial arch, medial process of calcaneal tuberosity; R soleus approx 6" proximal to calcaneal tuberosity TTP 0+: R/L navicular tuberosity, retrocalcaneal space, retromalleolar space medial/lateral, base of 5th, calcaneal tuberosity, FHL, EDL, EHL, peroneals, tibialis posterior/anterior  OUTCOME MEASURES  TEST SCORE INTERPRETATION  FOTO 59 Goal = 79    TREATMENT  Windlass stretch x 30 sec R/L Foot rolling over lacrosse ball x 30 sec Ankle 4-way red theraband  Added to HEP     Plan - 10/21/19 1624    Clinical Impression Statement  Patient is a pleasant 49 yo female presenting to PT with diagnosis of chronic bilateral plantar fasciitis. Examination is significant for mild limitations in DF on RLE consistent with wear of boot and mild gait disturbances and TTP over plantar surface of foot consistent with PF diagnosis; and  mild calf strength deficits R>L. BLE are otherwise very strong and underlying deficits are not apparent; in tandem with pt c/o mild intermittent recurring LBP following long shifts, suggestive of overuse etiology. Clinical impression: bilateral plantar fasciitis with RLE calf weakness and pain, likely secondary to chronic overuse in work environment. Patient will benefit from skilled physical therapy to reduce pain and improve endurance for prophylaxis.    Personal  Factors and Comorbidities  Age;Behavior Pattern;Time since onset of injury/illness/exacerbation   (+) age (-) behavior pattern (push through pain)   Examination-Activity Limitations  Locomotion Level;Stand;Squat    Examination-Participation Restrictions  Other   Work   Stability/Clinical Decision Making  Stable/Uncomplicated    Clinical Decision Making  Low    Rehab Potential  Good    PT Frequency  2x / week    PT Duration  6 weeks    PT Treatment/Interventions  ADLs/Self Care Home Management;Cryotherapy;Electrical Stimulation;Moist Heat;Ultrasound;Gait training;Functional mobility training;Therapeutic activities;Therapeutic exercise;Balance training;Neuromuscular re-education;Patient/family education;Manual techniques;Taping;Compression bandaging;Dry needling;Passive range of motion;Energy conservation;Splinting;Joint Manipulations    PT Next Visit Plan  Initiate manual and strengthening therex    PT Home Exercise Plan  4-way ankle, windlass stretch, lacrosse ball roller    Consulted and Agree with Plan of Care  Patient       Patient will benefit from skilled therapeutic intervention in order to improve the following deficits and impairments:  Abnormal gait, Decreased endurance, Difficulty walking, Decreased activity tolerance, Impaired flexibility, Pain, Decreased strength  Visit Diagnosis: Pain in left foot  Pain in right foot  Stiffness of right ankle, not elsewhere classified     Problem List Patient Active Problem List    Diagnosis Date Noted  . Post-operative state 03/18/2016  . Moderate aortic valve insufficiency 02/15/2016    Virgia Land, SPT 10/21/2019, 4:50 PM  [facility] [address], [place], [address] Phone: [phone]   Fax:  [phone]  Name: ARELYN GAUER MRN: [id number] Date of Birth: 1971/09/02

## 2019-10-26 ENCOUNTER — Ambulatory Visit: Payer: No Typology Code available for payment source

## 2019-10-26 ENCOUNTER — Other Ambulatory Visit: Payer: Self-pay

## 2019-10-26 DIAGNOSIS — M79672 Pain in left foot: Secondary | ICD-10-CM | POA: Diagnosis not present

## 2019-10-26 DIAGNOSIS — M79671 Pain in right foot: Secondary | ICD-10-CM

## 2019-10-26 DIAGNOSIS — M25671 Stiffness of right ankle, not elsewhere classified: Secondary | ICD-10-CM

## 2019-10-26 NOTE — Therapy (Signed)
[facility] [address], [place], [address] Phone: [phone]   Fax:  [phone]  Physical Therapy Treatment  Patient Details  Name: Laura Schmitt MRN: [id number] Date of Birth: Oct 06, 1970 Referring Provider (PT): Samara Deist   Encounter Date: 10/26/2019  PT End of Session - 10/26/19 0907    Visit Number  2    Number of Visits  17    Date for PT Re-Evaluation  12/02/19    Authorization Type  2/10    PT Start Time  0815    PT Stop Time  0900    PT Time Calculation (min)  45 min    Activity Tolerance  Patient tolerated treatment well;No increased pain       Past Medical History:  Diagnosis Date  . Anemia   . Cervical dysplasia   . Heart murmur    aortic valve   . Hemorrhoids, external, thrombosed   . Moderate aortic insufficiency     Past Surgical History:  Procedure Laterality Date  . ABDOMINAL HYSTERECTOMY    . BILATERAL SALPINGECTOMY Bilateral 03/18/2016   Procedure: BILATERAL SALPINGECTOMY;  Surgeon: Boykin Nearing, MD;  Location: [facility] ORS;  Service: Gynecology;  Laterality: Bilateral;  . COLONOSCOPY WITH PROPOFOL N/A 10/23/2017   Procedure: COLONOSCOPY WITH PROPOFOL;  Surgeon: Lollie Sails, MD;  Location: [facility] ENDOSCOPY;  Service: Endoscopy;  Laterality: N/A;  . I&D THROMBOSED HEMORRHOID  01/05/2008  . LEEP  08/14/2010  . Lt ear cyst    . SINUS EXPLORATION    . VAGINAL HYSTERECTOMY N/A 03/18/2016   Procedure: HYSTERECTOMY VAGINAL;  Surgeon: Boykin Nearing, MD;  Location: [facility] ORS;  Service: Gynecology;  Laterality: N/A;    There were no vitals filed for this visit.  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 Pt feels good walking in boot and no pain at start of therapy. Pt is compliant and consistent with HEP    Patient is accompained by:  Family member   daughter (young)   Pertinent History   C/C BLE foot pain. 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 Attempted stretches, orthotics, frozen water bottle rolling, foot massager, hot soaks, OTC medication without relief. Currently uses ibu and Lodine which provides relief for 6 hr. Developed achilles tendonitis in R foot, cortisone shots in June and Dec 2020 with some relief, put in walker boot 09/30/2019. Current pain 0/10, increasing to 8/10 in R radiating up RLE to mid-calf, 6/10 in L, over day's work. Pain is worse with first steps. Pain increases quickly (within 10 minutes walking). Agg: standing, walking. Ease: boot, antiinflamatories. Comorbidities: pt admits to LBP at end of shift    Limitations  Walking;Standing    How long can you sit comfortably?  unlimited    How long can you stand comfortably?  10 min    How long can you walk comfortably?  10 min    Patient Stated Goals  Walk without pain    Currently in Pain?  No/denies    Pain Location  Foot    Pain Onset  More than a month ago        TREATMENT  MT 2 x 30 seconds - STM w/ lacrosse ball BLE  Towel stretch - x 30 seconds  TE Heel Raises w/ TTWB 2 x 10  Toe  intrinscis w/ black band - x 40 each  Total gym SL calf raises - 3 x 10 at level 20 (Reps on R: 12, 10, 6) (Reps on L:12, 10, 7)  SL balance on airex 2  x 30 seconds   Manual Therapy and Therapeutic exercises performed in order to decrease patient's functional limitations and return her to prior level of function.       PT Education - 10/26/19 0907    Education Details  form/technique with exercise    Person(s) Educated  Patient    Methods  Explanation;Demonstration;Verbal cues    Comprehension  Verbalized understanding;Returned demonstration;Verbal cues required       PT Short Term Goals - 10/21/19 1633      PT SHORT TERM GOAL #1   Title  Patient will demonstrate adherence to HEP at least 3x/week as adjunct to clinical treatment  and to reduce total number of visits.    Baseline  HEP given    Time  2    Period  Weeks    Status  New    Target Date  11/04/19        PT Long Term Goals - 10/21/19 1634      PT LONG TERM GOAL #1   Title  Patient will improve FOTO score to 79 to indicate reduced disability.    Baseline  Eval: FOTO = 59    Time  6    Period  Weeks    Status  New    Target Date  12/02/19      PT LONG TERM GOAL #2   Title  Patient will report worst pain to 2/10 to demonstrate reduced pain and disability with her employment.    Baseline  Eval: Worst pain 8/10 R, 6/10 L    Time  6    Period  Weeks    Status  New    Target Date  12/02/19      PT LONG TERM GOAL #3   Title  Patient will demonstrate full AROM in RLE ankle to normalize gait and reduce risk of future injury.    Baseline  Eval: RLE DF -8 knee straight, 0 knee bent    Time  6    Period  Weeks    Status  New    Target Date  12/02/19      PT LONG TERM GOAL #4   Title  Patient will demonstrate 5/5 strength with BLE ankle plantarflexion for improved toe-off with gait over 12-hour work shift.    Baseline  Eval: 4/5 BLE    Time  6    Period  Weeks    Status  New    Target Date  12/02/19              Patient will benefit from skilled therapeutic intervention in order to improve the following deficits and impairments:     Visit Diagnosis: Pain in left foot  Pain in right foot  Stiffness of right ankle, not elsewhere classified     Problem List Patient Active Problem List   Diagnosis Date Noted  . Post-operative state 03/18/2016  . Moderate aortic valve insufficiency 02/15/2016    Gloriann Loan, SPT 10/26/2019, 9:09 AM  [facility] [address] [place], [address] Phone: [phone]   Fax:  [phone]  Name: NICKOL ROSEMAN MRN: [id number] Date of Birth: 1970/11/02

## 2019-10-28 ENCOUNTER — Ambulatory Visit: Payer: No Typology Code available for payment source

## 2019-11-01 ENCOUNTER — Ambulatory Visit: Payer: No Typology Code available for payment source | Attending: Podiatry

## 2019-11-01 ENCOUNTER — Other Ambulatory Visit: Payer: Self-pay

## 2019-11-01 DIAGNOSIS — M79672 Pain in left foot: Secondary | ICD-10-CM | POA: Insufficient documentation

## 2019-11-01 DIAGNOSIS — M25671 Stiffness of right ankle, not elsewhere classified: Secondary | ICD-10-CM | POA: Insufficient documentation

## 2019-11-01 DIAGNOSIS — M79671 Pain in right foot: Secondary | ICD-10-CM | POA: Diagnosis present

## 2019-11-01 NOTE — Therapy (Signed)
[facility] [address] [place], [address] Phone: [phone]   Fax:  [phone]  Physical Therapy Treatment  Patient Details  Name: Laura Schmitt MRN: [id number] Date of Birth: [birth year] Referring Provider (PT): Samara Deist   Encounter Date: 11/01/2019  PT End of Session - 11/01/19 1031    Visit Number  3    Number of Visits  17    Date for PT Re-Evaluation  12/02/19    Authorization Type  3/10    PT Start Time  0945    PT Stop Time  1030    PT Time Calculation (min)  45 min    Activity Tolerance  Patient tolerated treatment well;No increased pain       Past Medical History:  Diagnosis Date  . Anemia   . Cervical dysplasia   . Heart murmur    aortic valve   . Hemorrhoids, external, thrombosed   . Moderate aortic insufficiency     Past Surgical History:  Procedure Laterality Date  . ABDOMINAL HYSTERECTOMY    . BILATERAL SALPINGECTOMY Bilateral 03/18/2016   Procedure: BILATERAL SALPINGECTOMY;  Surgeon: Boykin Nearing, MD;  Location: [facility] ORS;  Service: Gynecology;  Laterality: Bilateral;  . COLONOSCOPY WITH PROPOFOL N/A 10/23/2017   Procedure: COLONOSCOPY WITH PROPOFOL;  Surgeon: Lollie Sails, MD;  Location: [facility] ENDOSCOPY;  Service: Endoscopy;  Laterality: N/A;  . I&D THROMBOSED HEMORRHOID  01/05/2008  . LEEP  08/14/2010  . Lt ear cyst    . SINUS EXPLORATION    . VAGINAL HYSTERECTOMY N/A 03/18/2016   Procedure: HYSTERECTOMY VAGINAL;  Surgeon: Boykin Nearing, MD;  Location: [facility] ORS;  Service: Gynecology;  Laterality: N/A;    There were no vitals filed for this visit.  Subjective Assessment - 11/01/19 1030    Subjective  Patient reports she felt sore after the previous session but is experiencing no pain currently. Patient reports she has been performing her HEP.    Patient is accompained by:  Family member   daughter (young)   Pertinent History  C/C BLE foot pain.  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 Attempted stretches, orthotics, frozen water bottle rolling, foot massager, hot soaks, OTC medication without relief. Currently uses ibu and Lodine which provides relief for 6 hr. Developed achilles tendonitis in R foot, cortisone shots in June and Dec 2020 with some relief, put in walker boot 09/30/2019. Current pain 0/10, increasing to 8/10 in R radiating up RLE to mid-calf, 6/10 in L, over day's work. Pain is worse with first steps. Pain increases quickly (within 10 minutes walking). Agg: standing, walking. Ease: boot, antiinflamatories. Comorbidities: pt admits to LBP at end of shift    Limitations  Walking;Standing    How long can you sit comfortably?  unlimited    How long can you stand comfortably?  10 min    How long can you walk comfortably?  10 min    Patient Stated Goals  Walk without pain    Currently in Pain?  No/denies    Pain Onset  More than a month ago       TREATMENT Therapeutic Exercise Resisted toe flexion (2-5) with GTB Arch lifts in sitting with use of penny for external cueing - 2 x 20 Heel lifts on total gym single leg level 20 - 2 x 10, x7 SLS on airex pad with ball toss- 3 x 20  B  heel lifts off of 6" step - 2 x 8 SLS on Bosu ball -  30 sec x 2  Performed exercises to improve muscle recruitment of the muscles that support the bottom of the foot and improve walking ability   PT Education - 11/01/19 1031    Education Details  form/technique with exercise    Person(s) Educated  Patient    Methods  Explanation;Demonstration    Comprehension  Verbalized understanding;Returned demonstration       PT Short Term Goals - 10/21/19 1633      PT SHORT TERM GOAL #1   Title  Patient will demonstrate adherence to HEP at least 3x/week as adjunct to clinical treatment and to reduce total number of visits.    Baseline  HEP given    Time  2    Period  Weeks     Status  New    Target Date  11/04/19        PT Long Term Goals - 10/21/19 1634      PT LONG TERM GOAL #1   Title  Patient will improve FOTO score to 79 to indicate reduced disability.    Baseline  Eval: FOTO = 59    Time  6    Period  Weeks    Status  New    Target Date  12/02/19      PT LONG TERM GOAL #2   Title  Patient will report worst pain to 2/10 to demonstrate reduced pain and disability with her employment.    Baseline  Eval: Worst pain 8/10 R, 6/10 L    Time  6    Period  Weeks    Status  New    Target Date  12/02/19      PT LONG TERM GOAL #3   Title  Patient will demonstrate full AROM in RLE ankle to normalize gait and reduce risk of future injury.    Baseline  Eval: RLE DF -8 knee straight, 0 knee bent    Time  6    Period  Weeks    Status  New    Target Date  12/02/19      PT LONG TERM GOAL #4   Title  Patient will demonstrate 5/5 strength with BLE ankle plantarflexion for improved toe-off with gait over 12-hour work shift.    Baseline  Eval: 4/5 BLE    Time  6    Period  Weeks    Status  New    Target Date  12/02/19            Plan - 11/01/19 1031    Clinical Impression Statement  Patient demonstrates improvement with ability to perform greater amount of repetitions of heel raises at the total gym. Although patient is improving, she demonstrates decreased activation of her flexor digitorum brevis limiting her able to support her arch in standing. Patient is improving overall and will benefit from further skilled therapy to return to prior level of function.    Personal Factors and Comorbidities  Age;Behavior Pattern;Time since onset of injury/illness/exacerbation   (+) age (-) behavior pattern (push through pain)   Examination-Activity Limitations  Locomotion Level;Stand;Squat    Examination-Participation Restrictions  Other   Work   Stability/Clinical Decision Making  Stable/Uncomplicated    Rehab Potential  Good    PT Frequency  2x / week    PT  Duration  6 weeks    PT Treatment/Interventions  ADLs/Self Care Home Management;Cryotherapy;Electrical Stimulation;Moist  Heat;Ultrasound;Gait training;Functional mobility training;Therapeutic activities;Therapeutic exercise;Balance training;Neuromuscular re-education;Patient/family education;Manual techniques;Taping;Compression bandaging;Dry needling;Passive range of motion;Energy conservation;Splinting;Joint Manipulations    PT Next Visit Plan  Initiate manual and strengthening therex    PT Home Exercise Plan  4-way ankle, windlass stretch, lacrosse ball roller, TTWB Calf raises    Consulted and Agree with Plan of Care  Patient       Patient will benefit from skilled therapeutic intervention in order to improve the following deficits and impairments:  Abnormal gait, Decreased endurance, Difficulty walking, Decreased activity tolerance, Impaired flexibility, Pain, Decreased strength  Visit Diagnosis: Pain in left foot  Pain in right foot  Stiffness of right ankle, not elsewhere classified     Problem List Patient Active Problem List   Diagnosis Date Noted  . Post-operative state 03/18/2016  . Moderate aortic valve insufficiency 02/15/2016    Blythe Stanford, PT DPT 11/01/2019, 11:21 AM  [facility] [address], [place], [address] Phone: [phone]   Fax:  [phone]  Name: Laura Schmitt MRN: [id number] Date of Birth: [birth year]

## 2019-11-04 ENCOUNTER — Ambulatory Visit: Payer: No Typology Code available for payment source

## 2019-11-09 ENCOUNTER — Ambulatory Visit: Payer: No Typology Code available for payment source

## 2019-11-11 ENCOUNTER — Ambulatory Visit: Payer: No Typology Code available for payment source

## 2019-11-15 ENCOUNTER — Ambulatory Visit: Payer: No Typology Code available for payment source

## 2019-11-18 ENCOUNTER — Ambulatory Visit: Payer: No Typology Code available for payment source

## 2019-11-23 ENCOUNTER — Ambulatory Visit: Payer: No Typology Code available for payment source

## 2019-11-25 ENCOUNTER — Ambulatory Visit: Payer: No Typology Code available for payment source

## 2019-11-30 ENCOUNTER — Ambulatory Visit: Payer: No Typology Code available for payment source

## 2019-12-02 ENCOUNTER — Ambulatory Visit: Payer: No Typology Code available for payment source

## 2019-12-06 ENCOUNTER — Ambulatory Visit: Payer: No Typology Code available for payment source | Attending: Podiatry

## 2019-12-08 ENCOUNTER — Ambulatory Visit: Payer: No Typology Code available for payment source

## 2020-02-08 ENCOUNTER — Other Ambulatory Visit
Admission: RE | Admit: 2020-02-08 | Discharge: 2020-02-08 | Disposition: A | Discharge status: Home / Self Care | Payer: No Typology Code available for payment source | Source: Ambulatory Visit | Attending: General Surgery | Admitting: General Surgery

## 2020-02-08 DIAGNOSIS — Z20822 Contact with and (suspected) exposure to covid-19: Secondary | ICD-10-CM | POA: Insufficient documentation

## 2020-02-08 DIAGNOSIS — Z01812 Encounter for preprocedural laboratory examination: Secondary | ICD-10-CM | POA: Diagnosis present

## 2020-02-08 LAB — SARS CORONAVIRUS 2 (TAT 6-24 HRS): SARS Coronavirus 2: NEGATIVE

## 2020-02-09 ENCOUNTER — Other Ambulatory Visit: Admission: RE | Admit: 2020-02-09 | Payer: No Typology Code available for payment source | Source: Ambulatory Visit

## 2020-02-10 ENCOUNTER — Encounter: Payer: Self-pay | Admitting: General Surgery

## 2020-02-11 ENCOUNTER — Ambulatory Visit
Admission: RE | Admit: 2020-02-11 | Discharge: 2020-02-11 | Disposition: A | Discharge status: Home / Self Care | Payer: No Typology Code available for payment source | Attending: General Surgery | Admitting: General Surgery

## 2020-02-11 ENCOUNTER — Ambulatory Visit: Payer: No Typology Code available for payment source | Admitting: Anesthesiology

## 2020-02-11 ENCOUNTER — Other Ambulatory Visit: Payer: Self-pay

## 2020-02-11 ENCOUNTER — Encounter: Payer: Self-pay | Admitting: *Deleted

## 2020-02-11 ENCOUNTER — Encounter
Admission: RE | Disposition: A | Discharge status: Home / Self Care | Payer: Self-pay | Source: Home / Self Care | Attending: General Surgery

## 2020-02-11 DIAGNOSIS — Z8371 Family history of colonic polyps: Secondary | ICD-10-CM | POA: Diagnosis not present

## 2020-02-11 DIAGNOSIS — R1084 Generalized abdominal pain: Secondary | ICD-10-CM | POA: Insufficient documentation

## 2020-02-11 DIAGNOSIS — K449 Diaphragmatic hernia without obstruction or gangrene: Secondary | ICD-10-CM | POA: Insufficient documentation

## 2020-02-11 DIAGNOSIS — D649 Anemia, unspecified: Secondary | ICD-10-CM | POA: Diagnosis not present

## 2020-02-11 DIAGNOSIS — Z79899 Other long term (current) drug therapy: Secondary | ICD-10-CM | POA: Insufficient documentation

## 2020-02-11 DIAGNOSIS — Z882 Allergy status to sulfonamides status: Secondary | ICD-10-CM | POA: Insufficient documentation

## 2020-02-11 DIAGNOSIS — K529 Noninfective gastroenteritis and colitis, unspecified: Secondary | ICD-10-CM | POA: Diagnosis not present

## 2020-02-11 DIAGNOSIS — K222 Esophageal obstruction: Secondary | ICD-10-CM | POA: Diagnosis not present

## 2020-02-11 DIAGNOSIS — R1013 Epigastric pain: Secondary | ICD-10-CM | POA: Diagnosis not present

## 2020-02-11 HISTORY — PX: ESOPHAGOGASTRODUODENOSCOPY (EGD) WITH PROPOFOL: SHX5813

## 2020-02-11 HISTORY — PX: COLONOSCOPY WITH PROPOFOL: SHX5780

## 2020-02-11 SURGERY — COLONOSCOPY WITH PROPOFOL
Anesthesia: General

## 2020-02-11 MED ORDER — SODIUM CHLORIDE 0.9 % IV SOLN
INTRAVENOUS | Status: DC
  Administered 2020-02-11: 1000 mL via INTRAVENOUS

## 2020-02-11 MED ORDER — PROPOFOL 500 MG/50ML IV EMUL
INTRAVENOUS | Status: DC | PRN
  Administered 2020-02-11: 120 ug/kg/min via INTRAVENOUS

## 2020-02-11 MED ORDER — LIDOCAINE HCL (CARDIAC) PF 100 MG/5ML IV SOSY
PREFILLED_SYRINGE | INTRAVENOUS | Status: DC | PRN
  Administered 2020-02-11: 100 mg via INTRAVENOUS

## 2020-02-11 MED ORDER — PROPOFOL 10 MG/ML IV BOLUS
INTRAVENOUS | Status: DC | PRN
  Administered 2020-02-11: 100 mg via INTRAVENOUS

## 2020-02-11 NOTE — H&P (Addendum)
Laura Schmitt [id number] 1971-04-19     HPI:  49 year old with IBS symptoms and a family history of colonic polyps.  Incomplete colonoscopy in 2019 by Dr. Gustavo Lah secondary to tortuous colon.   Upper GI symptoms with post prandial discomfort.  Tolerated Miralax prep well.  Finished all but last two cups. Reported stools clear x4 BM's after she discontinued prep.   Medications Prior to Admission  Medication Sig Dispense Refill Last Dose  . amoxicillin (AMOXIL) 875 MG tablet Take 1 tablet (875 mg total) by mouth 2 (two) times daily. (Patient not taking: Reported on 10/21/2019) 20 tablet 0   . cetirizine (ZYRTEC) 10 MG tablet Take 10 mg by mouth daily.     . Cyanocobalamin (VITAMIN B 12 PO) Take 1,000 mcg by mouth daily.     . diphenhydrAMINE (BENADRYL) 25 MG tablet Take 25 mg by mouth every 6 (six) hours as needed.     . famotidine (PEPCID) 20 MG tablet Take 1 tablet (20 mg total) by mouth 2 (two) times daily. (Patient not taking: Reported on 10/21/2019) 60 tablet 0   . ferrous sulfate 325 (65 FE) MG tablet Take 325 mg by mouth daily with breakfast.     . fluticasone (FLONASE) 50 MCG/ACT nasal spray Place 2 sprays into both nostrils daily.     Marland Kitchen ibuprofen (ADVIL,MOTRIN) 800 MG tablet Take 1 tablet (800 mg total) by mouth every 8 (eight) hours as needed (mild pain). 60 tablet 0   . montelukast (SINGULAIR) 10 MG tablet Take 1 tablet (10 mg total) by mouth at bedtime. 30 tablet 3   . predniSONE (DELTASONE) 10 MG tablet Take 3 tablets (30 mg total) by mouth daily with breakfast. (Patient not taking: Reported on 10/21/2019) 9 tablet 0    Allergies  Allergen Reactions  . Sulfa Antibiotics Rash   Past Medical History:  Diagnosis Date  . Anemia   . Cervical dysplasia   . Heart murmur    aortic valve   . Hemorrhoids, external, thrombosed   . Moderate aortic insufficiency    Past Surgical History:  Procedure Laterality Date  . ABDOMINAL HYSTERECTOMY    . BILATERAL SALPINGECTOMY Bilateral  03/18/2016   Procedure: BILATERAL SALPINGECTOMY;  Surgeon: Boykin Nearing, MD;  Location: [facility] ORS;  Service: Gynecology;  Laterality: Bilateral;  . COLONOSCOPY WITH PROPOFOL N/A 10/23/2017   Procedure: COLONOSCOPY WITH PROPOFOL;  Surgeon: Lollie Sails, MD;  Location: [facility] ENDOSCOPY;  Service: Endoscopy;  Laterality: N/A;  . I&D THROMBOSED HEMORRHOID  01/05/2008  . LEEP  08/14/2010  . Lt ear cyst    . SINUS EXPLORATION    . VAGINAL HYSTERECTOMY N/A 03/18/2016   Procedure: HYSTERECTOMY VAGINAL;  Surgeon: Boykin Nearing, MD;  Location: [facility] ORS;  Service: Gynecology;  Laterality: N/A;   Social History   Socioeconomic History  . Marital status: Married    Spouse name: Not on file  . Number of children: Not on file  . Years of education: Not on file  . Highest education level: Not on file  Occupational History  . Not on file  Tobacco Use  . Smoking status: Never Smoker  . Smokeless tobacco: Never Used  Substance and Sexual Activity  . Alcohol use: Yes    Comment: RARELY  . Drug use: No  . Sexual activity: Not on file  Other Topics Concern  . Not on file  Social History Narrative  . Not on file   Social Determinants of Health   Financial  Resource Strain:   . Difficulty of Paying Living Expenses:   Food Insecurity:   . Worried About Charity fundraiser in the Last Year:   . Arboriculturist in the Last Year:   Transportation Needs:   . Film/video editor (Medical):   Marland Kitchen Lack of Transportation (Non-Medical):   Physical Activity:   . Days of Exercise per Week:   . Minutes of Exercise per Session:   Stress:   . Feeling of Stress :   Social Connections:   . Frequency of Communication with Friends and Family:   . Frequency of Social Gatherings with Friends and Family:   . Attends Religious Services:   . Active Member of Clubs or Organizations:   . Attends Archivist Meetings:   Marland Kitchen Marital Status:   Intimate Partner Violence:   . Fear of  Current or Ex-Partner:   . Emotionally Abused:   Marland Kitchen Physically Abused:   . Sexually Abused:    Social History   Social History Narrative  . Not on file     ROS: Negative.     PE: HEENT: Negative. Lungs: Clear. Cardio: RR.II/VI murmur over aortic outflow tract.   Assessment/Plan:  Proceed with planned endoscopy. Forest Gleason [facility] 02/11/2020

## 2020-02-11 NOTE — Op Note (Addendum)
[facility] Gastroenterology Patient Name: Laura Schmitt Procedure Date: 02/11/2020 8:02 AM MRN: [id number] Account #: [id number] Date of Birth: April 02, 1971 Admit Type: Outpatient Age: 49 Room: [facility] ENDO ROOM 1 Gender: Female Note Status: Finalized Procedure:             Colonoscopy Indications:           Generalized abdominal pain, Chronic diarrhea Providers:             Robert Bellow, MD Medicines:             Monitored Anesthesia Care Complications:         No immediate complications. Procedure:             Pre-Anesthesia Assessment:                        - Prior to the procedure, a History and Physical was                         performed, and patient medications, allergies and                         sensitivities were reviewed. The patient's tolerance                         of previous anesthesia was reviewed.                        - The risks and benefits of the procedure and the                         sedation options and risks were discussed with the                         patient. All questions were answered and informed                         consent was obtained.                        After obtaining informed consent, the colonoscope was                         passed under direct vision. Throughout the procedure,                         the patient's blood pressure, pulse, and oxygen                         saturations were monitored continuously. The                         Colonoscope was introduced through the anus and                         advanced to the the terminal ileum. The colonoscopy                         was performed without difficulty. The patient  tolerated the procedure well. The quality of the bowel                         preparation was excellent. Findings:      The entire examined colon appeared normal on direct and retroflexion       views.      The perianal exam findings include internal  hemorrhoids that prolapse       with straining, but require manual replacement into the anal canal       (Grade III). Impression:            - The entire examined colon is normal on direct and                         retroflexion views.                        - No specimens collected. Recommendation:        - Telephone endoscopist for pathology results in 1                         week. Procedure Code(s):     --- Professional ---                        [phone], Colonoscopy, flexible; diagnostic, including                         collection of specimen(s) by brushing or washing, when                         performed (separate procedure) Diagnosis Code(s):     --- Professional ---                        K52.9, Noninfective gastroenteritis and colitis,                         unspecified                        R10.84, Generalized abdominal pain CPT copyright 2019 American Medical Association. All rights reserved. The codes documented in this report are preliminary and upon coder review may  be revised to meet current compliance requirements. Robert Bellow, MD 02/11/2020 9:02:24 AM This report has been signed electronically. Number of Addenda: 0 Note Initiated On: 02/11/2020 8:02 AM Scope Withdrawal Time: 0 hours 14 minutes 35 seconds  Total Procedure Duration: 0 hours 22 minutes 2 seconds       [facility]

## 2020-02-11 NOTE — Transfer of Care (Signed)
Immediate Anesthesia Transfer of Care Note  Patient: Laura Schmitt  Procedure(s) Performed: COLONOSCOPY WITH PROPOFOL (N/A ) ESOPHAGOGASTRODUODENOSCOPY (EGD) WITH PROPOFOL (N/A )  Patient Location: PACU and Endoscopy Unit  Anesthesia Type:General  Level of Consciousness: sedated  Airway & Oxygen Therapy: Patient Spontanous Breathing  Post-op Assessment: Report given to RN  Post vital signs: stable  Last Vitals:  Vitals Value Taken Time  BP 120/73 02/11/20 0904  Temp 36.7 C 02/11/20 0904  Pulse 80 02/11/20 0904  Resp 13 02/11/20 0904  SpO2 100 % 02/11/20 0904  Vitals shown include unvalidated device data.  Last Pain:  Vitals:   02/11/20 0904  TempSrc: Temporal  PainSc: 0-No pain         Complications: No apparent anesthesia complications

## 2020-02-11 NOTE — Anesthesia Postprocedure Evaluation (Signed)
Anesthesia Post Note  Patient: Laura Schmitt  Procedure(s) Performed: COLONOSCOPY WITH PROPOFOL (N/A ) ESOPHAGOGASTRODUODENOSCOPY (EGD) WITH PROPOFOL (N/A )  Patient location during evaluation: Endoscopy Anesthesia Type: General Level of consciousness: awake and alert Pain management: pain level controlled Vital Signs Assessment: post-procedure vital signs reviewed and stable Respiratory status: spontaneous breathing, nonlabored ventilation and respiratory function stable Cardiovascular status: blood pressure returned to baseline and stable Postop Assessment: no apparent nausea or vomiting Anesthetic complications: no     Last Vitals:  Vitals:   02/11/20 0807 02/11/20 0904  BP: (!) 142/68 102/73  Pulse: 78 83  Resp: 18 14  Temp: (!) 36.1 C 36.7 C  SpO2: 98% 100%    Last Pain:  Vitals:   02/11/20 0934  TempSrc:   PainSc: 0-No pain                 Alphonsus Sias

## 2020-02-11 NOTE — Op Note (Addendum)
[facility] Gastroenterology Patient Name: Laura Schmitt Procedure Date: 02/11/2020 8:02 AM MRN: [id number] Account #: [id number] Date of Birth: 1971-09-14 Admit Type: Outpatient Age: 49 Room: [facility] ENDO ROOM 1 Gender: Female Note Status: Finalized Procedure:             Upper GI endoscopy Indications:           Epigastric abdominal pain Providers:             Robert Bellow, MD Medicines:             Monitored Anesthesia Care Procedure:             Pre-Anesthesia Assessment:                        - Prior to the procedure, a History and Physical was                         performed, and patient medications, allergies and                         sensitivities were reviewed. The patient's tolerance                         of previous anesthesia was reviewed.                        - The risks and benefits of the procedure and the                         sedation options and risks were discussed with the                         patient. All questions were answered and informed                         consent was obtained.                        After obtaining informed consent, the endoscope was                         passed under direct vision. Throughout the procedure,                         the patient's blood pressure, pulse, and oxygen                         saturations were monitored continuously. The Endoscope                         was introduced through the mouth, and advanced to the                         second part of duodenum. The upper GI endoscopy was                         accomplished without difficulty. The patient tolerated  the procedure well. Findings:      A small hiatal hernia was present.      The stomach was normal.      The examined duodenum was normal.      A widely patent Schatzki ring was found at the lower esophageal       sphincter. Impression:            - Small hiatal hernia.  - Normal stomach.                        - Normal examined duodenum.                        - Widely patent Schatzki ring.                        - No specimens collected. Recommendation:        - Perform a colonoscopy today. Procedure Code(s):     --- Professional ---                        [phone], Esophagogastroduodenoscopy, flexible,                         transoral; diagnostic, including collection of                         specimen(s) by brushing or washing, when performed                         (separate procedure) Diagnosis Code(s):     --- Professional ---                        R10.13, Epigastric pain CPT copyright 2019 American Medical Association. All rights reserved. The codes documented in this report are preliminary and upon coder review may  be revised to meet current compliance requirements. Robert Bellow, MD 02/11/2020 8:36:12 AM This report has been signed electronically. Number of Addenda: 0 Note Initiated On: 02/11/2020 8:02 AM      [facility]

## 2020-02-11 NOTE — Anesthesia Preprocedure Evaluation (Signed)
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 Systolic function was normal. The estimated ejection  fraction was in the range of 60% to 65%.  - Aortic valve: There was mild to moderate regurgitation. Valve  area (VTI): 2.27 cm^2. Valve area (Vmax): 2.18 cm^2. Valve area  (Vmean): 2.31 cm^2.  - Mitral valve: There was mild regurgitation. Valve area by  continuity equation (using LVOT flow): 3.87 cm^2.  - Right ventricle: The cavity size was mildly dilated.    Neuro/Psych    GI/Hepatic   Endo/Other    Renal/GU      Musculoskeletal   Abdominal   Peds  Hematology  (+) Blood dyscrasia, anemia ,   Anesthesia Other Findings Past Medical History: No date: Anemia No date: Cervical dysplasia No date: Heart murmur     Comment:  aortic valve  No date: Hemorrhoids, external, thrombosed No date: Moderate aortic insufficiency  Past Surgical History: No date: ABDOMINAL HYSTERECTOMY 03/18/2016: BILATERAL SALPINGECTOMY; Bilateral     Comment:  Procedure: BILATERAL SALPINGECTOMY;  Surgeon: Boykin Nearing, MD;  Location: [facility] ORS;  Service:               Gynecology;  Laterality: Bilateral; 10/23/2017: COLONOSCOPY WITH PROPOFOL; N/A     Comment:  Procedure: COLONOSCOPY WITH PROPOFOL;  Surgeon:               Lollie Sails, MD;  Location: [facility] ENDOSCOPY;                Service: Endoscopy;  Laterality: N/A; 01/05/2008: I&D THROMBOSED  HEMORRHOID 08/14/2010: LEEP No date: Lt ear cyst No date: SINUS EXPLORATION 03/18/2016: VAGINAL HYSTERECTOMY; N/A     Comment:  Procedure: HYSTERECTOMY VAGINAL;  Surgeon: Boykin Nearing, MD;  Location: [facility] ORS;  Service:               Gynecology;  Laterality: N/A;     Reproductive/Obstetrics                             Anesthesia Physical Anesthesia Plan  ASA: II  Anesthesia Plan: General   Post-op Pain Management:    Induction: Intravenous  PONV Risk Score and Plan: Treatment may vary due to age or medical condition and TIVA  Airway Management Planned: Nasal Cannula and Natural Airway  Additional Equipment:   Intra-op Plan:   Post-operative Plan:   Informed Consent: I have reviewed the patients History and Physical, chart, labs and discussed the procedure including the risks, benefits and alternatives for the proposed anesthesia with the patient or authorized representative who has indicated his/her understanding and acceptance.     Dental Advisory Given  Plan Discussed with: CRNA  Anesthesia Plan Comments:         Anesthesia Quick Evaluation

## 2020-02-14 LAB — SURGICAL PATHOLOGY

## 2020-04-15 ENCOUNTER — Other Ambulatory Visit: Payer: Self-pay

## 2020-04-15 ENCOUNTER — Ambulatory Visit
Admission: EM | Admit: 2020-04-15 | Discharge: 2020-04-15 | Disposition: A | Discharge status: Home / Self Care | Payer: No Typology Code available for payment source | Attending: Family Medicine | Admitting: Family Medicine

## 2020-04-15 DIAGNOSIS — S39012A Strain of muscle, fascia and tendon of lower back, initial encounter: Secondary | ICD-10-CM

## 2020-04-15 MED ORDER — CYCLOBENZAPRINE HCL 10 MG PO TABS
10.0000 mg | ORAL_TABLET | Freq: Three times a day (TID) | ORAL | 0 refills | Status: AC | PRN
Start: 2020-04-15 — End: ?

## 2020-04-15 MED ORDER — KETOROLAC TROMETHAMINE 60 MG/2ML IM SOLN
60.0000 mg | Freq: Once | INTRAMUSCULAR | Status: AC
  Administered 2020-04-15: 60 mg via INTRAMUSCULAR

## 2020-04-15 MED ORDER — KETOROLAC TROMETHAMINE 10 MG PO TABS
10.0000 mg | ORAL_TABLET | Freq: Three times a day (TID) | ORAL | 0 refills | Status: AC | PRN
Start: 2020-04-15 — End: ?

## 2020-04-15 MED ORDER — HYDROCODONE-ACETAMINOPHEN 5-325 MG PO TABS: ORAL_TABLET | ORAL | 0 refills | Status: AC

## 2020-04-15 NOTE — ED Provider Notes (Signed)
[facility]    CSN: [id number] Arrival date & time: 04/15/20  0954      History   Chief Complaint Chief Complaint  Patient presents with  . Work Related Injury  . Back Pain    HPI Laura Schmitt is a 49 y.o. female.   49 yo female with a c/o low back pain since injuring it last night at work. 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 States the radiation down her leg resolved, however the low back pain has continued. Denies any numbness/tingling, bowel or bladder problems.    Back Pain   Past Medical History:  Diagnosis Date  . Anemia   . Cervical dysplasia   . Heart murmur    aortic valve   . Hemorrhoids, external, thrombosed   . Moderate aortic insufficiency     Patient Active Problem List   Diagnosis Date Noted  . Post-operative state 03/18/2016  . Moderate aortic valve insufficiency 02/15/2016    Past Surgical History:  Procedure Laterality Date  . ABDOMINAL HYSTERECTOMY    . BILATERAL SALPINGECTOMY Bilateral 03/18/2016   Procedure: BILATERAL SALPINGECTOMY;  Surgeon: Boykin Nearing, MD;  Location: [facility] ORS;  Service: Gynecology;  Laterality: Bilateral;  . COLONOSCOPY WITH PROPOFOL N/A 10/23/2017   Procedure: COLONOSCOPY WITH PROPOFOL;  Surgeon: Lollie Sails, MD;  Location: [facility] ENDOSCOPY;  Service: Endoscopy;  Laterality: N/A;  . COLONOSCOPY WITH PROPOFOL N/A 02/11/2020   Procedure: COLONOSCOPY WITH PROPOFOL;  Surgeon: Robert Bellow, MD;  Location: [facility] ENDOSCOPY;  Service: Endoscopy;  Laterality: N/A;  . ESOPHAGOGASTRODUODENOSCOPY (EGD) WITH PROPOFOL N/A 02/11/2020   Procedure: ESOPHAGOGASTRODUODENOSCOPY (EGD) WITH PROPOFOL;  Surgeon: Robert Bellow, MD;  Location: [facility] ENDOSCOPY;  Service: Endoscopy;  Laterality: N/A;  . I&D THROMBOSED HEMORRHOID  01/05/2008  . LEEP  08/14/2010  . Lt ear cyst    . SINUS  EXPLORATION    . VAGINAL HYSTERECTOMY N/A 03/18/2016   Procedure: HYSTERECTOMY VAGINAL;  Surgeon: Boykin Nearing, MD;  Location: [facility] ORS;  Service: Gynecology;  Laterality: N/A;    OB History   No obstetric history on file.      Home Medications    Prior to Admission medications   Medication Sig Start Date End Date Taking? Authorizing Provider  cetirizine (ZYRTEC) 10 MG tablet Take 10 mg by mouth daily.   Yes [provider]  etodolac (LODINE) 500 MG tablet Take 1 tablet by mouth daily as needed. 09/30/19  Yes [provider]  famotidine (PEPCID) 20 MG tablet Take 1 tablet (20 mg total) by mouth 2 (two) times daily. 09/28/17  Yes Arta Silence, MD  montelukast (SINGULAIR) 10 MG tablet Take 1 tablet (10 mg total) by mouth at bedtime. 11/01/15  Yes Fisher, Linden Dolin, PA-C  amoxicillin (AMOXIL) 875 MG tablet Take 1 tablet (875 mg total) by mouth 2 (two) times daily. 02/06/17   Fisher, Linden Dolin, PA-C  Cyanocobalamin (VITAMIN B 12 PO) Take 1,000 mcg by mouth daily.    [provider]  cyclobenzaprine (FLEXERIL) 10 MG tablet Take 1 tablet (10 mg total) by mouth 3 (three) times daily as needed for muscle spasms. 04/15/20   Norval Gable, MD  diphenhydrAMINE (BENADRYL) 25 MG tablet Take 25 mg by mouth every 6 (six) hours as needed.    [provider]  ferrous sulfate 325 (65 FE) MG tablet Take 325 mg  by mouth daily with breakfast.    [provider]  fluticasone (FLONASE) 50 MCG/ACT nasal spray Place 2 sprays into both nostrils daily.    [provider]  HYDROcodone-acetaminophen (NORCO/VICODIN) 5-325 MG tablet 1-2 tabs po qd prn 04/15/20   Norval Gable, MD  ibuprofen (ADVIL,MOTRIN) 800 MG tablet Take 1 tablet (800 mg total) by mouth every 8 (eight) hours as needed (mild pain). 03/19/16   Schermerhorn, Gwen Her, MD  ketorolac (TORADOL) 10 MG tablet Take 1 tablet (10 mg total) by mouth every 8 (eight) hours as needed. 04/15/20   Norval Gable, MD  predniSONE (DELTASONE) 10 MG tablet Take 3 tablets (30 mg total) by mouth daily with breakfast. 02/06/17   Caryn Section Linden Dolin, PA-C    Family History Family History  Problem Relation Age of Onset  . Healthy Mother   . Transient ischemic attack Father   . Hyperlipidemia Father     Social History Social History   Tobacco Use  . Smoking status: Never Smoker  . Smokeless tobacco: Never Used  Vaping Use  . Vaping Use: Never used  Substance Use Topics  . Alcohol use: Yes    Comment: RARELY  . Drug use: No     Allergies   Sulfa antibiotics   Review of Systems Review of Systems  Musculoskeletal: Positive for back pain.     Physical Exam Triage Vital Signs ED Triage Vitals  Enc Vitals Group     BP 04/15/20 1015 131/60     Pulse Rate 04/15/20 1015 78     Resp 04/15/20 1015 16     Temp 04/15/20 1015 98.2 F (36.8 C)     Temp Source 04/15/20 1015 Oral     SpO2 04/15/20 1015 100 %     Weight 04/15/20 1011 185 lb (83.9 kg)     Height 04/15/20 1011 5\' 4"  (1.626 m)     Head Circumference --      Peak Flow --      Pain Score 04/15/20 1011 4     Pain Loc --      Pain Edu? --      Excl. in [place]? --    No data found.  Updated Vital Signs BP 131/60 (BP Location: Left Arm)   Pulse 78   Temp 98.2 F (36.8 C) (Oral)   Resp 16   Ht 5\' 4"  (1.626 m)   Wt 83.9 kg   LMP 02/09/2016   SpO2 100%   BMI 31.76 kg/m   Visual Acuity Right Eye Distance:   Left Eye Distance:   Bilateral Distance:    Right Eye Near:   Left Eye Near:    Bilateral Near:     Physical Exam Vitals and nursing note reviewed.  Constitutional:      General: She is not in acute distress.    Appearance: She is not toxic-appearing or diaphoretic.  Musculoskeletal:     Lumbar back: Spasms (right paraspinous muscles) and tenderness (right paraspinous muscles) present. No swelling, edema, deformity, signs of trauma, lacerations or bony tenderness. Decreased range of motion. Negative right  straight leg raise test and negative left straight leg raise test. No scoliosis.  Neurological:     General: No focal deficit present.     Mental Status: She is alert.     Deep Tendon Reflexes: Reflexes normal.      UC Treatments / Results  Labs (all labs ordered are listed, but only abnormal results are displayed) Labs Reviewed -  No data to display  EKG   Radiology No results found.  Procedures Procedures (including critical care time)  Medications Ordered in UC Medications  ketorolac (TORADOL) injection 60 mg (60 mg Intramuscular Given 04/15/20 1109)    Initial Impression / Assessment and Plan / UC Course  I have reviewed the triage vital signs and the nursing notes.  Pertinent labs & imaging results that were available during my care of the patient were reviewed by me and considered in my medical decision making (see chart for details).      Final Clinical Impressions(s) / UC Diagnoses   Final diagnoses:  Strain of lumbar region, initial encounter     Discharge Instructions     Rest, tylenol, heat Follow up next week at [place] at [facility]     ED Prescriptions    Medication Parker. Provider   cyclobenzaprine (FLEXERIL) 10 MG tablet Take 1 tablet (10 mg total) by mouth 3 (three) times daily as needed for muscle spasms. 30 tablet Norval Gable, MD   ketorolac (TORADOL) 10 MG tablet Take 1 tablet (10 mg total) by mouth every 8 (eight) hours as needed. 15 tablet Norval Gable, MD   HYDROcodone-acetaminophen (NORCO/VICODIN) 5-325 MG tablet 1-2 tabs po qd prn 6 tablet Norval Gable, MD      1. diagnosis reviewed with patient 2. rx as per orders above; reviewed possible side effects, interactions, risks and benefits  3. Recommend supportive treatment as above 4. Work restrictions (light duty) as per form 5. Follow-up at [place] [facility] next week   I have reviewed the PDMP during this encounter.   Norval Gable, MD 04/15/20 1213

## 2020-04-15 NOTE — ED Triage Notes (Signed)
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 Patient states that pain has been constant since. Reports that she works for [employer].

## 2020-04-15 NOTE — Discharge Instructions (Signed)
Rest, tylenol, heat Follow up next week at [place] at [facility]

## 2020-04-27 ENCOUNTER — Other Ambulatory Visit: Payer: Self-pay | Admitting: Internal Medicine

## 2020-04-27 DIAGNOSIS — I351 Nonrheumatic aortic (valve) insufficiency: Secondary | ICD-10-CM

## 2020-04-27 DIAGNOSIS — R0602 Shortness of breath: Secondary | ICD-10-CM

## 2020-05-04 ENCOUNTER — Other Ambulatory Visit: Payer: Self-pay | Admitting: Internal Medicine

## 2020-05-04 DIAGNOSIS — Z1231 Encounter for screening mammogram for malignant neoplasm of breast: Secondary | ICD-10-CM

## 2020-05-10 ENCOUNTER — Ambulatory Visit
Admission: RE | Admit: 2020-05-10 | Discharge: 2020-05-10 | Disposition: A | Discharge status: Home / Self Care | Payer: No Typology Code available for payment source | Source: Ambulatory Visit | Attending: Internal Medicine | Admitting: Internal Medicine

## 2020-05-10 ENCOUNTER — Other Ambulatory Visit: Payer: Self-pay

## 2020-05-10 DIAGNOSIS — I351 Nonrheumatic aortic (valve) insufficiency: Secondary | ICD-10-CM | POA: Insufficient documentation

## 2020-05-10 DIAGNOSIS — R0602 Shortness of breath: Secondary | ICD-10-CM | POA: Diagnosis present

## 2020-05-10 LAB — ECHOCARDIOGRAM COMPLETE
AR max vel: 2.19 cm2
AV Area VTI: 2.39 cm2
AV Area mean vel: 2.21 cm2
AV Mean grad: 8 mmHg
AV Peak grad: 15.7 mmHg
Ao pk vel: 1.98 m/s
Area-P 1/2: 4.39 cm2
Calc EF: 61.8 %
P 1/2 time: 352 msec
S' Lateral: 3.54 cm
Single Plane A2C EF: 62 %
Single Plane A4C EF: 62.1 %

## 2020-05-10 NOTE — Progress Notes (Signed)
*  PRELIMINARY RESULTS* Echocardiogram 2D Echocardiogram has been performed.  Laura Schmitt 05/10/2020, 10:40 AM

## 2020-08-11 ENCOUNTER — Other Ambulatory Visit: Payer: Self-pay | Admitting: Internal Medicine

## 2020-12-14 ENCOUNTER — Other Ambulatory Visit: Payer: Self-pay | Admitting: Internal Medicine

## 2020-12-14 DIAGNOSIS — R519 Headache, unspecified: Secondary | ICD-10-CM

## 2020-12-14 DIAGNOSIS — R42 Dizziness and giddiness: Secondary | ICD-10-CM

## 2020-12-28 ENCOUNTER — Ambulatory Visit
Admission: RE | Admit: 2020-12-28 | Discharge: 2020-12-28 | Disposition: A | Discharge status: Home / Self Care | Payer: No Typology Code available for payment source | Source: Ambulatory Visit | Attending: Internal Medicine | Admitting: Internal Medicine

## 2020-12-28 ENCOUNTER — Other Ambulatory Visit: Payer: Self-pay

## 2020-12-28 DIAGNOSIS — R42 Dizziness and giddiness: Secondary | ICD-10-CM | POA: Insufficient documentation

## 2020-12-28 DIAGNOSIS — R519 Headache, unspecified: Secondary | ICD-10-CM | POA: Diagnosis present

## 2021-01-03 ENCOUNTER — Other Ambulatory Visit: Payer: Self-pay

## 2021-01-03 MED FILL — Montelukast Sodium Tab 10 MG (Base Equiv): ORAL | 90 days supply | Qty: 90 | Fill #0 | Status: AC

## 2021-02-15 ENCOUNTER — Other Ambulatory Visit: Payer: Self-pay

## 2021-02-15 MED ORDER — PREDNISONE 5 MG PO TABS
ORAL_TABLET | ORAL | 0 refills | Status: AC
  Filled 2021-02-15: qty 48, 12d supply, fill #0

## 2021-02-15 MED ORDER — FLUOCINOLONE ACETONIDE 0.01 % OT OIL
TOPICAL_OIL | OTIC | 3 refills | Status: AC
  Filled 2021-02-15: qty 20, 30d supply, fill #0

## 2021-02-15 MED ORDER — FLUTICASONE PROPIONATE 50 MCG/ACT NA SUSP
NASAL | 12 refills | Status: AC
  Filled 2021-02-15: qty 16, 30d supply, fill #0

## 2021-03-29 ENCOUNTER — Other Ambulatory Visit: Payer: Self-pay

## 2021-03-29 ENCOUNTER — Ambulatory Visit: Payer: No Typology Code available for payment source | Admitting: Dermatology

## 2021-03-29 DIAGNOSIS — D2239 Melanocytic nevi of other parts of face: Secondary | ICD-10-CM

## 2021-03-29 DIAGNOSIS — L80 Vitiligo: Secondary | ICD-10-CM | POA: Diagnosis not present

## 2021-03-29 DIAGNOSIS — L821 Other seborrheic keratosis: Secondary | ICD-10-CM | POA: Diagnosis not present

## 2021-03-29 DIAGNOSIS — D229 Melanocytic nevi, unspecified: Secondary | ICD-10-CM

## 2021-03-29 DIAGNOSIS — I872 Venous insufficiency (chronic) (peripheral): Secondary | ICD-10-CM

## 2021-03-29 DIAGNOSIS — L918 Other hypertrophic disorders of the skin: Secondary | ICD-10-CM

## 2021-03-29 DIAGNOSIS — I8393 Asymptomatic varicose veins of bilateral lower extremities: Secondary | ICD-10-CM | POA: Diagnosis not present

## 2021-03-29 DIAGNOSIS — D489 Neoplasm of uncertain behavior, unspecified: Secondary | ICD-10-CM

## 2021-03-29 MED ORDER — TRIAMCINOLONE ACETONIDE 0.1 % EX OINT
1.0000 | TOPICAL_OINTMENT | CUTANEOUS | 1 refills | Status: AC
Start: 2021-03-29 — End: ?
  Filled 2021-03-29: qty 80, 20d supply, fill #0

## 2021-03-29 MED ORDER — MUPIROCIN 2 % EX OINT
1.0000 "application " | TOPICAL_OINTMENT | Freq: Three times a day (TID) | CUTANEOUS | 0 refills | Status: AC
  Filled 2021-03-29: qty 22, 8d supply, fill #0

## 2021-03-29 NOTE — Patient Instructions (Addendum)
Topical steroids (such as triamcinolone, fluocinolone, fluocinonide, mometasone, clobetasol, halobetasol, betamethasone, hydrocortisone) can cause thinning and lightening of the skin if they are used for too long in the same area. Your physician has selected the right strength medicine for your problem and area affected on the body. Please use your medication only as directed by your physician to prevent side effects.       Biopsy Wound Care Instructions  Leave the original bandage on for 24 hours if possible.  If the bandage becomes soaked or soiled before that time, it is OK to remove it and examine the wound.  A small amount of post-operative bleeding is normal.  If excessive bleeding occurs, remove the bandage, place gauze over the site and apply continuous pressure (no peeking) over the area for 30 minutes. If this does not work, please call our clinic as soon as possible or page your doctor if it is after hours.   Once a day, cleanse the wound with soap and water. It is fine to shower. If a thick crust develops you may use a Q-tip dipped into dilute hydrogen peroxide (mix 1:1 with water) to dissolve it.  Hydrogen peroxide can slow the healing process, so use it only as needed.    After washing, apply petroleum jelly (Vaseline) or an antibiotic ointment if your doctor prescribed one for you, followed by a bandage.    For best healing, the wound should be covered with a layer of ointment at all times. If you are not able to keep the area covered with a bandage to hold the ointment in place, this may mean re-applying the ointment several times a day.  Continue this wound care until the wound has healed and is no longer open.   Itching and mild discomfort is normal during the healing process. However, if you develop pain or severe itching, please call our office.   If you have any discomfort, you can take Tylenol (acetaminophen) or ibuprofen as directed on the bottle. (Please do not take these  if you have an allergy to them or cannot take them for another reason).  Some redness, tenderness and white or yellow material in the wound is normal healing.  If the area becomes very sore and red, or develops a thick yellow-green material (pus), it may be infected; please notify us.    If you have stitches, return to clinic as directed to have the stitches removed. You will continue wound care for 2-3 days after the stitches are removed.   Wound healing continues for up to one year following surgery. It is not unusual to experience pain in the scar from time to time during the interval.  If the pain becomes severe or the scar thickens, you should notify the office.    A slight amount of redness in a scar is expected for the first six months.  After six months, the redness will fade and the scar will soften and fade.  The color difference becomes less noticeable with time.  If there are any problems, return for a post-op surgery check at your earliest convenience.  To improve the appearance of the scar, you can use silicone scar gel, cream, or sheets (such as Mederma or Serica) every night for up to one year. These are available over the counter (without a prescription).  Please call our office at [phone] for any questions or concerns.  Seborrheic Keratosis  What causes seborrheic keratoses? Seborrheic keratoses are harmless, common skin growths that first  appear during adult life.  As time goes by, more growths appear.  Some people may develop a large number of them.  Seborrheic keratoses appear on both covered and uncovered body parts.  They are not caused by sunlight.  The tendency to develop seborrheic keratoses can be inherited.  They vary in color from skin-colored to gray, brown, or even black.  They can be either smooth or have a rough, warty surface.   Seborrheic keratoses are superficial and look as if they were stuck on the skin.  Under the microscope this type of keratosis looks  like layers upon layers of skin.  That is why at times the top layer may seem to fall off, but the rest of the growth remains and re-grows.    Treatment Seborrheic keratoses do not need to be treated, but can easily be removed in the office.  Seborrheic keratoses often cause symptoms when they rub on clothing or jewelry.  Lesions can be in the way of shaving.  If they become inflamed, they can cause itching, soreness, or burning.  Removal of a seborrheic keratosis can be accomplished by freezing, burning, or surgery. If any spot bleeds, scabs, or grows rapidly, please return to have it checked, as these can be an indication of a skin cancer.  Recommend taking Heliocare sun protection supplement daily in sunny weather for additional sun protection. For maximum protection on the sunniest days, you can take up to 2 capsules of regular Heliocare OR take 1 capsule of Heliocare Ultra. For prolonged exposure (such as a full day in the sun), you can repeat your dose of the supplement 4 hours after your first dose. Heliocare can be purchased at [facility] or at [email or web address].   If you have any questions or concerns for your doctor, please call our main line at [phone] and press option 4 to reach your doctor's medical assistant. If no one answers, please leave a voicemail as directed and we will return your call as soon as possible. Messages left after 4 pm will be answered the following business day.   You may also send [place] a message via [place]. We typically respond to MyChart messages within 1-2 business days.  For prescription refills, please ask your pharmacy to contact our office. Our fax number is [phone].  If you have an urgent issue when the clinic is closed that cannot wait until the next business day, you can page your doctor at the number below.    Please note that while we do our best to be available for urgent issues outside of office hours, we are not available 24/7.    If you have an urgent issue and are unable to reach [place], you may choose to seek medical care at your doctor's office, retail clinic, urgent care center, or emergency room.  If you have a medical emergency, please immediately call 911 or go to the emergency department.  Pager Numbers  - Dr. Nehemiah Massed: [phone]  - Dr. Laurence Ferrari: [phone]  - Dr. Nicole Kindred: [phone]  In the event of inclement weather, please call our main line at [phone] for an update on the status of any delays or closures.  Dermatology Medication Tips: Please keep the boxes that topical medications come in in order to help keep track of the instructions about where and how to use these. Pharmacies typically print the medication instructions only on the boxes and not directly on the medication tubes.   If your medication is too expensive, please  contact our office at [phone] option 4 or send [place] a message through [place].   We are unable to tell what your co-pay for medications will be in advance as this is different depending on your insurance coverage. However, we may be able to find a substitute medication at lower cost or fill out paperwork to get insurance to cover a needed medication.   If a prior authorization is required to get your medication covered by your insurance company, please allow [place] 1-2 business days to complete this process.  Drug prices often vary depending on where the prescription is filled and some pharmacies may offer cheaper prices.  The website www.goodrx.com contains coupons for medications through different pharmacies. The prices here do not account for what the cost may be with help from insurance (it may be cheaper with your insurance), but the website can give you the price if you did not use any insurance.  - You can print the associated coupon and take it with your prescription to the pharmacy.  - You may also stop by our office during regular business hours and pick up a  GoodRx coupon card.  - If you need your prescription sent electronically to a different pharmacy, notify our office through [facility] or by phone at [phone] option 4.

## 2021-03-29 NOTE — Progress Notes (Signed)
New Patient Visit  Subjective  Laura Schmitt is a 50 y.o. female who presents for the following: Follow-up (Patient states she has a skin tag on left nostril that she would like to have removed. She states she is has been here in past, it has been a few years. Patient has a few spots on right side of face near corner of eye she would like checked. She also reports a few place on back she would like checked. Patient states her mother has  had a history of melanoma and other skin cancers. Patient states she has had history of moles removed in past. Patient would like to discuss spider vein treatments.).   The following portions of the chart were reviewed this encounter and updated as appropriate:   Tobacco  Allergies  Meds  Problems  Med Hx  Surg Hx  Fam Hx      Review of Systems:  No other skin or systemic complaints except as noted in HPI or Assessment and Plan.  Objective  Well appearing patient in no apparent distress; mood and affect are within normal limits.  A focused examination was performed including face, bilateral legs, back, nose, arms, neck. Relevant physical exam findings are noted in the Assessment and Plan.  Left Ala Nasi 0.2 cm erythematous skin colored papule      Right Lower Leg - Anterior Purple and Blue-green veins  Right Upper Arm - Anterior Depigmented patches  Right Lower Leg - Anterior Erythematous patches   Assessment & Plan  Neoplasm of uncertain behavior Left Ala Nasi  Epidermal / dermal shaving  Lesion diameter (cm):  0.2 Informed consent: discussed and consent obtained   Timeout: patient name, date of birth, surgical site, and procedure verified   Procedure prep:  Patient was prepped and draped in usual sterile fashion Prep type:  Isopropyl alcohol Anesthesia: the lesion was anesthetized in a standard fashion   Anesthetic:  1% lidocaine w/ epinephrine 1-100,000 buffered w/ 8.4% NaHCO3 Instrument used: flexible razor blade    Hemostasis achieved with: pressure, aluminum chloride and electrodesiccation   Outcome: patient tolerated procedure well   Post-procedure details: sterile dressing applied and wound care instructions given   Dressing type: bandage and petrolatum   Additional details:  Mupirocin and a bandage applied  mupirocin ointment (BACTROBAN) 2 % Apply 1 application topically 3 (three) times daily. Until healed  Specimen 1 - Surgical pathology Differential Diagnosis: R/o angiofibroma vs other   Check Margins: No  0.2 cm erythematous skin colored papule   R/o angiofibroma vs other   Spider veins of both lower extremities Right Lower Leg - Anterior  Patient states veins are painful  Recommend seeing vascular surgery to see if this is covered by insurance given symptoms  Discussed sclerotherapy treatment     Vitiligo Right Upper Arm - Anterior  Reviewed chronic nature, no cure and can be difficult to treat.  Vitiligo is an autoimmune condition which causes loss of skin pigment and is commonly seen on the face and may also involve areas of trauma like hands, elbows, knees, and ankles.  Treatments include topical steroids and other topical anti-inflammatory ointments/creams.  Sometimes narrow band UV light therapy or Xtrac laser is helpful, both of which require twice weekly treatments for at least 3-6 months.  Antioxidant vitamins, such as Vitamins C&E, and alpha lipoic acid may be added to enhance treatment.  Discussed treatment options  Patient deferred treatment at this time.     Venous stasis dermatitis  of right lower extremity Right Lower Leg - Anterior  Chronic condition with duration or expected duration over one year. Condition is bothersome to patient. Not currently at goal.  Stasis in the legs causes chronic leg swelling, which may result in itchy or painful rashes, skin discoloration, skin texture changes, and sometimes ulceration.  Recommend daily compression  hose/stockings- easiest to put on first thing in morning, remove at bedtime.  Elevate legs as much as possible. Avoid salt/sodium rich foods.  Appears when not wearing compression stockings   Start  Triamcinolone 0.1% ointment apply twice daily as needed to affected areas of right lower leg for up to 2 weeks. Avoid applying to face, groin, and axilla. Use as directed. Risk of skin atrophy with long-term use reviewed.    Topical steroids (such as triamcinolone, fluocinolone, fluocinonide, mometasone, clobetasol, halobetasol, betamethasone, hydrocortisone) can cause thinning and lightening of the skin if they are used for too long in the same area. Your physician has selected the right strength medicine for your problem and area affected on the body. Please use your medication only as directed by your physician to prevent side effects.     triamcinolone ointment (KENALOG) 0.1 % - Right Lower Leg - Anterior Apply 1 application topically See admin instructions. apply twice daily as needed to affected areas of right lower leg. Avoid applying to face, groin, and axilla. Use as directed.   Seborrheic Keratoses - Stuck-on, waxy, tan-brown papules and/or plaques at corner of right eye and back  - Benign-appearing - Discussed benign etiology and prognosis. - Observe - Call for any changes  Acrochordons (Skin Tags) - Fleshy, skin-colored pedunculated papules at right side back, and neck  - Benign appearing.  - Observe. - If desired, they can be removed with an in office procedure that is not covered by insurance. Patient will schedule an appointment to come back and have removed.  - Please call the clinic if you notice any new or changing lesions.  Melanocytic Nevi - Tan-brown and/or pink-flesh-colored symmetric macules and papules - Benign appearing on exam today - Observation - Call clinic for new or changing moles - Recommend daily use of broad spectrum spf 30+ sunscreen to sun-exposed  areas.   Return for skin tag removal appointment , and tbse in a few months . I, Ruthell Rummage, CMA, am acting as scribe for Forest Gleason, MD.    Documentation: I have reviewed the above documentation for accuracy and completeness, and I agree with the above.  Forest Gleason, MD

## 2021-04-10 ENCOUNTER — Telehealth: Payer: Self-pay

## 2021-04-10 NOTE — Telephone Encounter (Signed)
-----   Message from Brendolyn Patty, MD sent at 04/09/2021  8:46 PM EDT ----- Skin , left ala nasi FIBROUS PAPULE  benign - please call pt

## 2021-04-10 NOTE — Telephone Encounter (Signed)
Advised patient of results/hd  

## 2021-04-15 ENCOUNTER — Encounter: Payer: Self-pay | Admitting: Dermatology

## 2021-05-01 ENCOUNTER — Other Ambulatory Visit: Payer: Self-pay

## 2021-05-03 ENCOUNTER — Other Ambulatory Visit: Payer: Self-pay

## 2021-05-03 MED ORDER — MONTELUKAST SODIUM 10 MG PO TABS
ORAL_TABLET | Freq: Every day | ORAL | 1 refills | Status: DC
  Filled 2021-05-03: qty 90, 90d supply, fill #0
  Filled 2021-10-05: qty 90, 90d supply, fill #1

## 2021-05-07 ENCOUNTER — Other Ambulatory Visit: Payer: Self-pay | Admitting: Physician Assistant

## 2021-05-07 ENCOUNTER — Ambulatory Visit
Admission: RE | Admit: 2021-05-07 | Discharge: 2021-05-07 | Disposition: A | Discharge status: Home / Self Care | Payer: No Typology Code available for payment source | Source: Ambulatory Visit | Attending: Physician Assistant | Admitting: Physician Assistant

## 2021-05-07 ENCOUNTER — Ambulatory Visit
Admission: RE | Admit: 2021-05-07 | Discharge: 2021-05-07 | Disposition: A | Discharge status: Home / Self Care | Payer: PRIVATE HEALTH INSURANCE | Attending: Physician Assistant | Admitting: Physician Assistant

## 2021-05-07 DIAGNOSIS — W19XXXA Unspecified fall, initial encounter: Secondary | ICD-10-CM

## 2021-05-07 DIAGNOSIS — W228XXA Striking against or struck by other objects, initial encounter: Secondary | ICD-10-CM | POA: Diagnosis not present

## 2021-05-07 DIAGNOSIS — S6992XA Unspecified injury of left wrist, hand and finger(s), initial encounter: Secondary | ICD-10-CM | POA: Insufficient documentation

## 2021-05-11 ENCOUNTER — Other Ambulatory Visit: Payer: Self-pay

## 2021-05-11 MED ORDER — BACLOFEN 10 MG PO TABS
ORAL_TABLET | ORAL | 0 refills | Status: AC
  Filled 2021-05-11: qty 15, 5d supply, fill #0

## 2021-05-11 MED ORDER — PREDNISONE 10 MG PO TABS
ORAL_TABLET | ORAL | 0 refills | Status: AC
  Filled 2021-05-11: qty 21, 6d supply, fill #0

## 2021-05-11 MED ORDER — TRAMADOL HCL 50 MG PO TABS
ORAL_TABLET | ORAL | 0 refills | Status: AC
  Filled 2021-05-11: qty 20, 5d supply, fill #0

## 2021-05-15 ENCOUNTER — Other Ambulatory Visit: Payer: Self-pay

## 2021-05-15 MED ORDER — CARESTART COVID-19 HOME TEST VI KIT
PACK | 0 refills | Status: DC
  Filled 2021-05-15: qty 2, 4d supply, fill #0

## 2021-05-24 ENCOUNTER — Other Ambulatory Visit: Payer: Self-pay

## 2021-05-25 ENCOUNTER — Other Ambulatory Visit: Payer: Self-pay

## 2021-05-25 MED ORDER — ETODOLAC 500 MG PO TABS
ORAL_TABLET | Freq: Every day | ORAL | 1 refills | Status: AC | PRN
  Filled 2021-05-25: qty 90, 90d supply, fill #0

## 2021-06-20 ENCOUNTER — Other Ambulatory Visit: Payer: Self-pay | Admitting: Internal Medicine

## 2021-06-20 DIAGNOSIS — Z1231 Encounter for screening mammogram for malignant neoplasm of breast: Secondary | ICD-10-CM

## 2021-06-25 ENCOUNTER — Other Ambulatory Visit: Payer: Self-pay

## 2021-06-25 MED ORDER — POTASSIUM CHLORIDE ER 10 MEQ PO TBCR
EXTENDED_RELEASE_TABLET | ORAL | 2 refills | Status: AC
  Filled 2021-06-25: qty 30, 30d supply, fill #0

## 2021-07-12 ENCOUNTER — Other Ambulatory Visit: Payer: Self-pay

## 2021-07-12 ENCOUNTER — Ambulatory Visit (INDEPENDENT_AMBULATORY_CARE_PROVIDER_SITE_OTHER): Payer: No Typology Code available for payment source | Admitting: Dermatology

## 2021-07-12 DIAGNOSIS — L918 Other hypertrophic disorders of the skin: Secondary | ICD-10-CM | POA: Diagnosis not present

## 2021-07-12 DIAGNOSIS — L304 Erythema intertrigo: Secondary | ICD-10-CM

## 2021-07-12 MED ORDER — KETOCONAZOLE 2 % EX CREA
1.0000 "application " | TOPICAL_CREAM | Freq: Two times a day (BID) | CUTANEOUS | 2 refills | Status: AC
  Filled 2021-07-12: qty 60, 14d supply, fill #0

## 2021-07-12 MED ORDER — HYDROCORTISONE 2.5 % EX CREA
TOPICAL_CREAM | CUTANEOUS | 2 refills | Status: AC
  Filled 2021-07-12: qty 28, 7d supply, fill #0

## 2021-07-12 NOTE — Progress Notes (Signed)
   Follow-Up Visit   Subjective  Laura Schmitt is a 50 y.o. female who presents for the following: Skin Tag (Patient here today for skin tag removal at inframammary. Areas are irritated. ).  The following portions of the chart were reviewed this encounter and updated as appropriate:   Tobacco  Allergies  Meds  Problems  Med Hx  Surg Hx  Fam Hx      Review of Systems:  No other skin or systemic complaints except as noted in HPI or Assessment and Plan.  Objective  Well appearing patient in no apparent distress; mood and affect are within normal limits.  A focused examination was performed including neck, inframammary. Relevant physical exam findings are noted in the Assessment and Plan.  Right Inframammary Fold Erythematous patches  Assessment & Plan  Erythema intertrigo Right Inframammary Fold  Intertrigo is a chronic recurrent rash that occurs in skin fold areas that may be associated with friction; heat; moisture; yeast; fungus; and bacteria.  It is exacerbated by increased movement / activity; sweating; and higher atmospheric temperature.  Start HC 2.5% cream twice daily for up to 1 week. Start ketoconazole 2% cream twice daily for rash as needed.   hydrocortisone 2.5 % cream - Right Inframammary Fold Apply twice daily for up to 1 week as needed for rash.  ketoconazole (NIZORAL) 2 % cream - Right Inframammary Fold Apply 1 application topically 2 (two) times daily.  Acrochordons (Skin Tags) - Removal desired by patient - Fleshy, skin-colored pedunculated papules - Benign appearing.  - Patient desires removal. Reviewed that this is not covered by insurance and they will be charged a cosmetic fee for removal. Patient signed non-covered consent.  - Prior to the procedure, reviewed the expected small wound. Also reviewed the risk of leaving a small scar and the small risk of infection.  PROCEDURE - The areas were prepped with isopropyl alcohol. A small amount of  lidocaine 1% with epinephrine was injected at the base of each lesion to achieve good local anesthesia. The skin tags were removed using a snip technique. Aluminum chloride was used for hemostasis. Petrolatum and a bandage were applied. The procedure was tolerated well. - Wound care was reviewed with the patient. They were advised to call with any concerns. Total number of treated acrochordons 15  Return for TBSE, as scheduled.  Graciella Belton, RMA, am acting as scribe for Forest Gleason, MD .  Documentation: I have reviewed the above documentation for accuracy and completeness, and I agree with the above.  Forest Gleason, MD

## 2021-07-12 NOTE — Patient Instructions (Addendum)
Cryotherapy Aftercare  Wash gently with soap and water everyday.   Apply Vaseline and Band-Aid daily until healed.  Prior to procedure, discussed risks of blister formation, small wound, skin dyspigmentation, or rare scar following cryotherapy. Recommend Vaseline ointment to treated areas while healing.    If you have any questions or concerns for your doctor, please call our main line at [phone] and press option 4 to reach your doctor's medical assistant. If no one answers, please leave a voicemail as directed and we will return your call as soon as possible. Messages left after 4 pm will be answered the following business day.   You may also send us a message via MyChart. We typically respond to MyChart messages within 1-2 business days.  For prescription refills, please ask your pharmacy to contact our office. Our fax number is [phone].  If you have an urgent issue when the clinic is closed that cannot wait until the next business day, you can page your doctor at the number below.    Please note that while we do our best to be available for urgent issues outside of office hours, we are not available 24/7.   If you have an urgent issue and are unable to reach us, you may choose to seek medical care at your doctor's office, retail clinic, urgent care center, or emergency room.  If you have a medical emergency, please immediately call 911 or go to the emergency department.  Pager Numbers  - Dr. Kowalski: [phone]  - Dr. Moye: [phone]  - Dr. Stewart: [phone]  In the event of inclement weather, please call our main line at [phone] for an update on the status of any delays or closures.  Dermatology Medication Tips: Please keep the boxes that topical medications come in in order to help keep track of the instructions about where and how to use these. Pharmacies typically print the medication instructions only on the boxes and not directly on the medication  tubes.   If your medication is too expensive, please contact our office at [phone] option 4 or send us a message through MyChart.   We are unable to tell what your co-pay for medications will be in advance as this is different depending on your insurance coverage. However, we may be able to find a substitute medication at lower cost or fill out paperwork to get insurance to cover a needed medication.   If a prior authorization is required to get your medication covered by your insurance company, please allow us 1-2 business days to complete this process.  Drug prices often vary depending on where the prescription is filled and some pharmacies may offer cheaper prices.  The website www.goodrx.com contains coupons for medications through different pharmacies. The prices here do not account for what the cost may be with help from insurance (it may be cheaper with your insurance), but the website can give you the price if you did not use any insurance.  - You can print the associated coupon and take it with your prescription to the pharmacy.  - You may also stop by our office during regular business hours and pick up a GoodRx coupon card.  - If you need your prescription sent electronically to a different pharmacy, notify our office through [facility] MyChart or by phone at [phone] option 4.  

## 2021-07-16 ENCOUNTER — Ambulatory Visit
Admission: RE | Admit: 2021-07-16 | Discharge: 2021-07-16 | Disposition: A | Discharge status: Home / Self Care | Payer: No Typology Code available for payment source | Source: Ambulatory Visit | Attending: Internal Medicine | Admitting: Internal Medicine

## 2021-07-16 ENCOUNTER — Other Ambulatory Visit: Payer: Self-pay

## 2021-07-16 DIAGNOSIS — Z1231 Encounter for screening mammogram for malignant neoplasm of breast: Secondary | ICD-10-CM | POA: Diagnosis not present

## 2021-07-19 ENCOUNTER — Encounter: Payer: Self-pay | Admitting: Dermatology

## 2021-07-20 ENCOUNTER — Other Ambulatory Visit: Payer: Self-pay

## 2021-07-20 MED ORDER — CARESTART COVID-19 HOME TEST VI KIT
PACK | 0 refills | Status: DC
  Filled 2021-07-20: qty 2, 4d supply, fill #0

## 2021-08-16 ENCOUNTER — Ambulatory Visit: Payer: No Typology Code available for payment source | Admitting: Dermatology

## 2021-08-21 ENCOUNTER — Other Ambulatory Visit: Payer: Self-pay

## 2021-08-21 MED ORDER — CARESTART COVID-19 HOME TEST VI KIT
PACK | 0 refills | Status: DC
  Filled 2021-08-21: qty 2, 4d supply, fill #0

## 2021-08-22 ENCOUNTER — Other Ambulatory Visit: Payer: Self-pay

## 2021-08-22 MED ORDER — AMOXICILLIN-POT CLAVULANATE 875-125 MG PO TABS
ORAL_TABLET | ORAL | 0 refills | Status: DC
  Filled 2021-08-22: qty 20, 10d supply, fill #0

## 2021-08-22 MED ORDER — CARESTART COVID-19 HOME TEST VI KIT
PACK | 0 refills | Status: DC
Start: 2021-08-22 — End: 2021-10-11
  Filled 2021-08-22: qty 2, 4d supply, fill #0

## 2021-10-05 ENCOUNTER — Other Ambulatory Visit: Payer: Self-pay

## 2021-10-11 ENCOUNTER — Other Ambulatory Visit: Payer: Self-pay

## 2021-10-11 MED ORDER — CARESTART COVID-19 HOME TEST VI KIT
PACK | 0 refills | Status: AC
Start: 2021-10-11 — End: ?
  Filled 2021-10-11: qty 2, 4d supply, fill #0

## 2021-10-29 ENCOUNTER — Telehealth: Payer: No Typology Code available for payment source | Admitting: Emergency Medicine

## 2021-10-29 ENCOUNTER — Other Ambulatory Visit: Payer: Self-pay

## 2021-10-29 DIAGNOSIS — B9689 Other specified bacterial agents as the cause of diseases classified elsewhere: Secondary | ICD-10-CM | POA: Diagnosis not present

## 2021-10-29 DIAGNOSIS — J019 Acute sinusitis, unspecified: Secondary | ICD-10-CM | POA: Diagnosis not present

## 2021-10-29 MED ORDER — AMOXICILLIN-POT CLAVULANATE 875-125 MG PO TABS
1.0000 | ORAL_TABLET | Freq: Two times a day (BID) | ORAL | 0 refills | Status: AC
  Filled 2021-10-29: qty 14, 7d supply, fill #0

## 2021-10-29 NOTE — Progress Notes (Signed)
Virtual Visit Consent   Laura Schmitt, you are scheduled for a virtual visit with a [facility] provider today.     Just as with appointments in the office, your consent must be obtained to participate.  Your consent will be active for this visit and any virtual visit you may have with one of our providers in the next 365 days.     If you have a MyChart account, a copy of this consent can be sent to you electronically.  All virtual visits are billed to your insurance company just like a traditional visit in the office.    As this is a virtual visit, video technology does not allow for your provider to perform a traditional examination.  This may limit your provider's ability to fully assess your condition.  If your provider identifies any concerns that need to be evaluated in person or the need to arrange testing (such as labs, EKG, etc.), we will make arrangements to do so.     Although advances in technology are sophisticated, we cannot ensure that it will always work on either your end or our end.  If the connection with a video visit is poor, the visit may have to be switched to a telephone visit.  With either a video or telephone visit, we are not always able to ensure that we have a secure connection.     I need to obtain your verbal consent now.   Are you willing to proceed with your visit today?    AYELET GRUENEWALD has provided verbal consent on 10/29/2021 for a virtual visit (video or telephone).   Carvel Getting, NP   Date: 10/29/2021 9:15 AM   Virtual Visit via Video Note   I, Carvel Getting, connected with  Laura Schmitt  ([id number], 09/15/1971) on 10/29/21 at  9:15 AM EST by a video-enabled telemedicine application and verified that I am speaking with the correct person using two identifiers.  Location: Patient: Virtual Visit Location Patient: Home Provider: Virtual Visit Location Provider: Home Office   I discussed the limitations of evaluation and management by telemedicine  and the availability of in person appointments. The patient expressed understanding and agreed to proceed.    History of Present Illness: Laura Schmitt is a 51 y.o. who identifies as a female who was assigned female at birth, and is being seen today for purulent nasal discharge.  She became ill on 10/25/2021 with a mild sore throat that resolved by the next day but then congestion started.  She also describes a very mild cough and some postnasal drainage.  She did test herself for COVID at home and it was negative.  She did have COVID back in November.  She has been treating her symptoms with Mucinex and saline irrigation.  Last night to this morning she developed thick green nasal discharge that is progressively worsening.  She denies fever or chills.  She thinks she may have a sinus infection.  Last sinus infection was over 10 years ago.  HPI: HPI  Problems:  Patient Active Problem List   Diagnosis Date Noted   Post-operative state 03/18/2016   Moderate aortic valve insufficiency 02/15/2016    Allergies:  Allergies  Allergen Reactions   Sulfa Antibiotics Rash   Medications:  Current Outpatient Medications:    amoxicillin-clavulanate (AUGMENTIN) 875-125 MG tablet, Take 1 tablet by mouth 2 (two) times daily., Disp: 14 tablet, Rfl: 0   baclofen (LIORESAL) 10 MG tablet, Take 1  tablet (10 mg total) by mouth 3 (three) times daily for 30 days, Disp: 15 tablet, Rfl: 0   cetirizine (ZYRTEC) 10 MG tablet, Take 10 mg by mouth daily., Disp: , Rfl:    COVID-19 At Home Antigen Test (CARESTART COVID-19 HOME TEST) KIT, use as directed, Disp: 2 kit, Rfl: 0   Cyanocobalamin (VITAMIN B 12 PO), Take 1,000 mcg by mouth daily., Disp: , Rfl:    cyclobenzaprine (FLEXERIL) 10 MG tablet, Take 1 tablet (10 mg total) by mouth 3 (three) times daily as needed for muscle spasms., Disp: 30 tablet, Rfl: 0   diphenhydrAMINE (BENADRYL) 25 MG tablet, Take 25 mg by mouth every 6 (six) hours as needed., Disp: , Rfl:     etodolac (LODINE) 500 MG tablet, Take 1 tablet by mouth daily as needed., Disp: , Rfl:    etodolac (LODINE) 500 MG tablet, TAKE 1 TABLET BY MOUTH ONCE DAILY AS NEEDED, Disp: 90 tablet, Rfl: 1   famotidine (PEPCID) 20 MG tablet, Take 1 tablet (20 mg total) by mouth 2 (two) times daily., Disp: 60 tablet, Rfl: 0   ferrous sulfate 325 (65 FE) MG tablet, Take 325 mg by mouth daily with breakfast., Disp: , Rfl:    Fluocinolone Acetonide 0.01 % OIL, Apply 4 drops to affected ear once or twice weekly as needed for itching, Disp: 20 mL, Rfl: 3   fluticasone (FLONASE) 50 MCG/ACT nasal spray, Spray twice in each nostril twice daily, Disp: 16 g, Rfl: 12   HYDROcodone-acetaminophen (NORCO/VICODIN) 5-325 MG tablet, 1-2 tabs po qd prn, Disp: 6 tablet, Rfl: 0   hydrocortisone 2.5 % cream, Apply twice daily for up to 1 week as needed for rash., Disp: 28 g, Rfl: 2   ibuprofen (ADVIL,MOTRIN) 800 MG tablet, Take 1 tablet (800 mg total) by mouth every 8 (eight) hours as needed (mild pain)., Disp: 60 tablet, Rfl: 0   ketorolac (TORADOL) 10 MG tablet, Take 1 tablet (10 mg total) by mouth every 8 (eight) hours as needed., Disp: 15 tablet, Rfl: 0   montelukast (SINGULAIR) 10 MG tablet, TAKE 1 TABLET BY MOUTH ONCE DAILY, Disp: 90 tablet, Rfl: 1   mupirocin ointment (BACTROBAN) 2 %, Apply 1 application topically 3 (three) times daily. Until healed, Disp: 22 g, Rfl: 0   potassium chloride (KLOR-CON) 10 MEQ tablet, Take 1 tablet (10 mEq total) by mouth once daily, Disp: 30 tablet, Rfl: 2   predniSONE (DELTASONE) 10 MG tablet, Take 3 tablets (30 mg total) by mouth daily with breakfast., Disp: 9 tablet, Rfl: 0   predniSONE (DELTASONE) 10 MG tablet, Take 6 tabs on day 1, 5 tabs on day 2, 4 tabs on day 3, 3 tabs on day 4, 2 tabs on day 5, 1 tab on day 6. Then stop., Disp: 21 tablet, Rfl: 0   predniSONE (DELTASONE) 5 MG tablet, Take 6 tabs by mouth daily for 4 days, 4 tabs daily for 4 days,2 tabs daily for 4 days, then stop., Disp: 48  tablet, Rfl: 0   traMADol (ULTRAM) 50 MG tablet, Take 1 tablet (50 mg total) by mouth every 6 (six) hours as needed for up to 10 days, Disp: 20 tablet, Rfl: 0   triamcinolone ointment (KENALOG) 0.1 %, Apply 1 application topically See admin instructions. apply twice daily as needed to affected areas of right lower leg. Avoid applying to face, groin, and axilla. Use as directed., Disp: 80 g, Rfl: 1  Observations/Objective: Patient is well-developed, well-nourished in no acute distress.  Resting  comfortably at home.  Head is normocephalic, atraumatic.  No labored breathing.  Speech is clear and coherent with logical content.  Patient is alert and oriented at baseline.  Patient sounds congested on video  Assessment and Plan: 1. Acute bacterial sinusitis  Patient continue Mucinex, saline irrigation and to resume her Flonase nasal spray.  Prescribed Augmentin.  Patient declined offer a work note.  Follow Up Instructions: I discussed the assessment and treatment plan with the patient. The patient was provided an opportunity to ask questions and all were answered. The patient agreed with the plan and demonstrated an understanding of the instructions.  A copy of instructions were sent to the patient via MyChart unless otherwise noted below.  The patient was advised to call back or seek an in-person evaluation if the symptoms worsen or if the condition fails to improve as anticipated.  Time:  I spent 8 minutes with the patient via telehealth technology discussing the above problems/concerns.    Carvel Getting, NP

## 2021-10-29 NOTE — Patient Instructions (Signed)
Berton Lan, thank you for joining Carvel Getting, NP for today's virtual visit.  While this provider is not your primary care provider (PCP), if your PCP is located in our provider database this encounter information will be shared with them immediately following your visit.  Consent: (Patient) Laura Schmitt provided verbal consent for this virtual visit at the beginning of the encounter.  Current Medications:  Current Outpatient Medications:    amoxicillin-clavulanate (AUGMENTIN) 875-125 MG tablet, Take 1 tablet by mouth 2 (two) times daily., Disp: 14 tablet, Rfl: 0   baclofen (LIORESAL) 10 MG tablet, Take 1 tablet (10 mg total) by mouth 3 (three) times daily for 30 days, Disp: 15 tablet, Rfl: 0   cetirizine (ZYRTEC) 10 MG tablet, Take 10 mg by mouth daily., Disp: , Rfl:    COVID-19 At Home Antigen Test (CARESTART COVID-19 HOME TEST) KIT, use as directed, Disp: 2 kit, Rfl: 0   Cyanocobalamin (VITAMIN B 12 PO), Take 1,000 mcg by mouth daily., Disp: , Rfl:    cyclobenzaprine (FLEXERIL) 10 MG tablet, Take 1 tablet (10 mg total) by mouth 3 (three) times daily as needed for muscle spasms., Disp: 30 tablet, Rfl: 0   diphenhydrAMINE (BENADRYL) 25 MG tablet, Take 25 mg by mouth every 6 (six) hours as needed., Disp: , Rfl:    etodolac (LODINE) 500 MG tablet, Take 1 tablet by mouth daily as needed., Disp: , Rfl:    etodolac (LODINE) 500 MG tablet, TAKE 1 TABLET BY MOUTH ONCE DAILY AS NEEDED, Disp: 90 tablet, Rfl: 1   famotidine (PEPCID) 20 MG tablet, Take 1 tablet (20 mg total) by mouth 2 (two) times daily., Disp: 60 tablet, Rfl: 0   ferrous sulfate 325 (65 FE) MG tablet, Take 325 mg by mouth daily with breakfast., Disp: , Rfl:    Fluocinolone Acetonide 0.01 % OIL, Apply 4 drops to affected ear once or twice weekly as needed for itching, Disp: 20 mL, Rfl: 3   fluticasone (FLONASE) 50 MCG/ACT nasal spray, Spray twice in each nostril twice daily, Disp: 16 g, Rfl: 12   HYDROcodone-acetaminophen  (NORCO/VICODIN) 5-325 MG tablet, 1-2 tabs po qd prn, Disp: 6 tablet, Rfl: 0   hydrocortisone 2.5 % cream, Apply twice daily for up to 1 week as needed for rash., Disp: 28 g, Rfl: 2   ibuprofen (ADVIL,MOTRIN) 800 MG tablet, Take 1 tablet (800 mg total) by mouth every 8 (eight) hours as needed (mild pain)., Disp: 60 tablet, Rfl: 0   ketorolac (TORADOL) 10 MG tablet, Take 1 tablet (10 mg total) by mouth every 8 (eight) hours as needed., Disp: 15 tablet, Rfl: 0   montelukast (SINGULAIR) 10 MG tablet, TAKE 1 TABLET BY MOUTH ONCE DAILY, Disp: 90 tablet, Rfl: 1   mupirocin ointment (BACTROBAN) 2 %, Apply 1 application topically 3 (three) times daily. Until healed, Disp: 22 g, Rfl: 0   potassium chloride (KLOR-CON) 10 MEQ tablet, Take 1 tablet (10 mEq total) by mouth once daily, Disp: 30 tablet, Rfl: 2   predniSONE (DELTASONE) 10 MG tablet, Take 3 tablets (30 mg total) by mouth daily with breakfast., Disp: 9 tablet, Rfl: 0   predniSONE (DELTASONE) 10 MG tablet, Take 6 tabs on day 1, 5 tabs on day 2, 4 tabs on day 3, 3 tabs on day 4, 2 tabs on day 5, 1 tab on day 6. Then stop., Disp: 21 tablet, Rfl: 0   predniSONE (DELTASONE) 5 MG tablet, Take 6 tabs by mouth daily for 4  days, 4 tabs daily for 4 days,2 tabs daily for 4 days, then stop., Disp: 48 tablet, Rfl: 0   traMADol (ULTRAM) 50 MG tablet, Take 1 tablet (50 mg total) by mouth every 6 (six) hours as needed for up to 10 days, Disp: 20 tablet, Rfl: 0   triamcinolone ointment (KENALOG) 0.1 %, Apply 1 application topically See admin instructions. apply twice daily as needed to affected areas of right lower leg. Avoid applying to face, groin, and axilla. Use as directed., Disp: 80 g, Rfl: 1   Medications ordered in this encounter:  Meds ordered this encounter  Medications   amoxicillin-clavulanate (AUGMENTIN) 875-125 MG tablet    Sig: Take 1 tablet by mouth 2 (two) times daily.    Dispense:  14 tablet    Refill:  0     *If you need refills on other  medications prior to your next appointment, please contact your pharmacy*  Follow-Up: Call back or seek an in-person evaluation if the symptoms worsen or if the condition fails to improve as anticipated.  Other Instructions Continue using Mucinex, saline irrigation, and please resume using your Flonase as prescribed.  Drink lots of liquids to stay hydrated and to help thin out your congestion.  Other things that can help congestion to drain are things like taking steamy showers and Vicks vapor rub.   If you have been instructed to have an in-person evaluation today at a local Urgent Care facility, please use the link below. It will take you to a list of all of our available [facility], including address, phone number and hours of operation. Please do not delay care.  [facility]  If you or a family member do not have a primary care provider, use the link below to schedule a visit and establish care. When you choose a [facility] primary care physician or advanced practice provider, you gain a long-term partner in health. Find a Primary Care Provider  Learn more about [facility]'s in-office and virtual care options: [facility]

## 2021-11-08 ENCOUNTER — Other Ambulatory Visit: Payer: Self-pay

## 2021-11-08 MED ORDER — METHYLPREDNISOLONE 4 MG PO TBPK
ORAL_TABLET | ORAL | 0 refills | Status: AC
  Filled 2021-11-08: qty 21, 6d supply, fill #0

## 2021-11-14 ENCOUNTER — Other Ambulatory Visit: Payer: Self-pay

## 2021-11-14 ENCOUNTER — Ambulatory Visit: Payer: No Typology Code available for payment source | Attending: Podiatry | Admitting: Physical Therapy

## 2021-11-14 DIAGNOSIS — M6281 Muscle weakness (generalized): Secondary | ICD-10-CM | POA: Insufficient documentation

## 2021-11-14 DIAGNOSIS — R29898 Other symptoms and signs involving the musculoskeletal system: Secondary | ICD-10-CM | POA: Diagnosis present

## 2021-11-14 DIAGNOSIS — M25572 Pain in left ankle and joints of left foot: Secondary | ICD-10-CM | POA: Diagnosis present

## 2021-11-14 DIAGNOSIS — M25672 Stiffness of left ankle, not elsewhere classified: Secondary | ICD-10-CM | POA: Insufficient documentation

## 2021-11-14 NOTE — Therapy (Signed)
[facility] [address] [place], [address] Phone: [phone]   Fax:  [phone]  Physical Therapy Evaluation  Patient Details  Name: Laura Schmitt MRN: [id number] Date of Birth: 03-28-1971 Referring Provider (PT): Samara Deist, DPM   Encounter Date: 11/14/2021   PT End of Session - 11/14/21 1324     Visit Number 1    Number of Visits 24    Date for PT Re-Evaluation 02/06/22    Authorization Type [employer] reporting period from 11/14/2021    Progress Note Due on Visit 10    PT Start Time 1115    PT Stop Time 1200    PT Time Calculation (min) 45 min    Activity Tolerance Patient tolerated treatment well    Behavior During Therapy [facility] for tasks assessed/performed             Past Medical History:  Diagnosis Date   Anemia    Cervical dysplasia    Dysplastic nevus 05/11/2008   RLQA - mild to moderate   Dysplastic nevus 05/11/2008   RUQA - moderate, excision 06/02/2008   Dysplastic nevus 05/11/2008   L distal pretibial - excision 06/09/2008   Heart murmur    aortic valve    Hemorrhoids, external, thrombosed    Moderate aortic insufficiency     Past Surgical History:  Procedure Laterality Date   ABDOMINAL HYSTERECTOMY     BILATERAL SALPINGECTOMY Bilateral 03/18/2016   Procedure: BILATERAL SALPINGECTOMY;  Surgeon: Boykin Nearing, MD;  Location: [facility] ORS;  Service: Gynecology;  Laterality: Bilateral;   COLONOSCOPY WITH PROPOFOL N/A 10/23/2017   Procedure: COLONOSCOPY WITH PROPOFOL;  Surgeon: Lollie Sails, MD;  Location: [facility] ENDOSCOPY;  Service: Endoscopy;  Laterality: N/A;   COLONOSCOPY WITH PROPOFOL N/A 02/11/2020   Procedure: COLONOSCOPY WITH PROPOFOL;  Surgeon: Robert Bellow, MD;  Location: [facility] ENDOSCOPY;  Service: Endoscopy;  Laterality: N/A;   ESOPHAGOGASTRODUODENOSCOPY (EGD) WITH PROPOFOL N/A 02/11/2020   Procedure: ESOPHAGOGASTRODUODENOSCOPY (EGD) WITH PROPOFOL;   Surgeon: Robert Bellow, MD;  Location: [facility] ENDOSCOPY;  Service: Endoscopy;  Laterality: N/A;   I&D THROMBOSED HEMORRHOID  01/05/2008   LEEP  08/14/2010   Lt ear cyst     SINUS EXPLORATION     VAGINAL HYSTERECTOMY N/A 03/18/2016   Procedure: HYSTERECTOMY VAGINAL;  Surgeon: Boykin Nearing, MD;  Location: [facility] ORS;  Service: Gynecology;  Laterality: N/A;    There were no vitals filed for this visit.    Subjective Assessment - 11/14/21 1129     Subjective Patient reports she has had pain over the left achilles tendon for months but on Nov 01, 2021 she was in tears after the pain was nearly unbearable for the two previous work days. This is the last time she worked and she was almost in tears due to the amount of pain she was in. Her left achilles pain was almost unbearable whenever she put weight on it. She had to call out of work. She went to Dr. Vickki Muff the following Thursday (soonest appointment).   Patient states she has been to PT for plantar fasciitis that got better after PT in 2021 but she was unable to continue with this due to financial reasons. 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 They have been short staffed.  Dr. Vickki Muff put her in the boot for a month and took her out of work until Feb 23rd. She has previously  worn the boot for plantar fasciitis. He also gave her some steroid medications to take. She states since she has not been on her feet and has been wearing her boot she feels some improvement. She has been on her feet more since COVID19 and over the past year. She does not recall any other increases in activity. She used to walk a lot, but stopped because of the pain. She thinks the steroids have helped a little but it is hard for her to sleep because of them. She is on a taper so the side effects are getting better. She has done some stretching exercises such as calf stretch (it burns when she does it and feels like it stretches out a lot and  feels okay after). The boot helps it a the most. Has used night splint (can only use for 1-2 hours because it is more uncomfortable - achy in ankle - so she got a new boot and sleeps in the recliner), custom orthotics, using boot at work.   At it's worst, the pain was worst at the achilles tendon, very tender to the touch, radiating up to the calf but not higher than the knee. Did not have additional pain in the plantar surface of the foot. She has a history of plantar-faciitis (heel and arch pain). When she is on her feet a lot she feels pain "in the bone" over the top of her foot. With the plantar fasciitis she had a couple of spots that felt like tingling in spots on the bottom or sides of her heel but otherwise denies  paresthesia.   Right plantar part of the foot gives her trouble as well but left foot has always been the worst. She has had steroid injections on both plantar aspects of feet 3 times without relief. She usually feels the plantar faciitis in the anterior aspect of the bottom of her heel & arch.    Pertinent History Patient is a 51 y.o. female who presents to outpatient physical therapy with a referral for medical diagnosis achilles tendon pain. This patient's chief complaints consist of left sided pain over the achilles tendon that radiates towards the knee in the setting of chronic plantar fascia pain leading to the following functional deficits: difficulty with weight bearing activities such as walking, standing, pushing, pulling, lifting, working as a Chartered certified accountant, Control and instrumentation engineer, social participation, hobbies, household and community mobility, going up hills, navigating stairs, etc.  Relevant past medical history and comorbidities include bilateral plantar fasciitis, low back pain without sciatica.  Patient denies hx of cancer, stroke, seizures, lung problem, major cardiac events, diabetes, unexplained weight loss, changes in bowel or bladder problems, new onset stumbling or dropping things, spinal  surgery, ankle/foot surgery, sciatic pain.    Limitations Lifting;Standing;Walking;House hold activities;Other (comment)   difficulty with weight bearing activities such as walking, standing, pushing, pulling, lifting, working as a Chartered certified accountant, Control and instrumentation engineer, social participation, hobbies, household and community mobility, going up hills, navigating stairs, etc.   Currently in Pain? No/denies   C: 0/10; W: 7/10   Pain Score 0-No pain   C: 0/10 (sitting) or 1/10 (walking on it with boot); W: 7/10   Pain Location Ankle   over achilles tendon   Pain Orientation Left    Pain Descriptors / Indicators Burning;Throbbing   pulling   Pain Type Chronic pain    Pain Radiating Towards has radiated up calf and towards back of knee    Pain Onset More than a month ago  Pain Frequency Intermittent    Aggravating Factors  weight bearing activities    Pain Relieving Factors boot, steroids, hot/cold helps (ice more), resting    Effect of Pain on Daily Activities Functional Limitations: difficulty with weight bearing activities such as walking, weight bearing, decreased activity level, difficult to participate in family activities or go places, difficult to bake (side business), going up hills, stairs.              [facility] PT Assessment - 11/14/21 1138       Assessment   Medical Diagnosis achilles tendon pain    Referring Provider (PT) Samara Deist, DPM    Hand Dominance Right    Prior Therapy yes for plantar fasciitis      Precautions   Precautions --   staying off feet and out of work until 11/22/2021   Required Braces or Orthoses Other Brace/Splint    Other Brace/Splint cam boot   for exercise and shower     Restrictions   Weight Bearing Restrictions No      Balance Screen   Has the patient fallen in the past 6 months No    Has the patient had a decrease in activity level because of a fear of falling?  No    Is the patient reluctant to leave their home because of a fear of falling?  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    Leisure spending time with family,      Cognition   Overall Cognitive Status Within Functional Limits for tasks assessed               OBJECTIVE  SELF- REPORTED FUNCTION FOTO score: 47/100 (lower leg without knee questionnaire)  OBSERVATION/INSPECTION Posture Posture (seated): limited weight bearing on first MTP joint, arch height WNL Anthropometrics Tremor: none Body composition: BMI 38.1 Muscle bulk: appears WFL Skin: appears WFL Edema: nil Functional Mobility Bed mobility: sit <> supine and rolling I Transfers: sit <> stand mod I careful with weight bearing on L LE when out of boot.  Gait: I for household and short community distance while wearing boot with altered gait consistent with wearing a cam boot on L LE. Very slow and careful with lack of L ankle PF at push off, short R step length, decreased L stance time, and antalgic gait favoring L LE without boot.   NEUROLOGICAL Dermatomes L2-S2 appears equal and intact to light touch   PERIPHERAL JOINT MOTION (in degrees)  Active Range of Motion (AROM) *Indicates pain 11/14/21 Date Date  Joint/Motion R/L R/L R/L  Ankle/Foot     Dorsiflexion (knee ext) 12/5* / /  Dorsiflexion (knee flex) 12/0 / /  Plantarflexion 58/50 / /  [place]ison 12/18 / /  Inversion 35/25 / /  Great toe extension / / /  Great toe flexion / / /    Passive Range of Motion (PROM) *Indicates pain 11/14/21 Date Date  Joint/Motion R/L R/L R/L  Ankle/Foot     Dorsiflexion (knee ext) 30*/20* / /  Dorsiflexion (knee flex) 30*/25* / /  Great toe extension 84/84* / /   MUSCLE PERFORMANCE (MMT):  *Indicates pain 11/14/21 Date Date  Joint/Motion R/L R/L R/L  Knee     Extension (L3) 5/5 / /  Flexion (S2) 5/5 / /  Ankle/Foot     Dorsiflexion (L4) 5/3+* / /  Great toe extension (L5) 5/3+ / /  Eversion (S1) 5/4 / /  Plantarflexion (S1) 5/4 / /  Inversion 4/4 / /  Pronation 4+/4* / /  Great toe flexion 3+/3 / /   PALPATION: TTP grade III over L distal achillese tending, most at insertion.  TTP grade II over B arches TTP grade I over L medial heel, lateral ankle, top of foot, lower calf.   Objective measurements completed on examination: See above findings.    TREATMENT:   Therapeutic exercise: to centralize symptoms and improve ROM, strength, muscular endurance, and activity tolerance required for successful completion of functional activities.  - standing DLS isometric heel raise, 1x45 seconds (partial elevation of heel), encouragement to weight bear equally (shifts away from left), reports discomfort.  - seated L SLS isometric heel raise while leaning body weight on left knee, 1x45 seconds (more comfortable than standing).  - Education on diagnosis, prognosis, POC, anatomy and physiology of current condition.  - Education on HEP including handout   Pt required multimodal cuing for proper technique and to facilitate improved neuromuscular control, strength, range of motion, and functional ability resulting in improved performance and form.  HOME EXERCISE PROGRAM Access Code: [email or web address] URL: [email or web address] Date: 11/14/2021 Prepared by: Rosita Kea  Exercises Seated Isometric Ankle Plantarflexion - 3 x daily - 1 sets - 4 reps - 45 seconds hold - 2 minutes rest    PT Education - 11/14/21 1315     Education Details Exercise purpose/form. Self management techniques. Education on diagnosis, prognosis, POC, anatomy and physiology of current condition Education on HEP including handout    Person(s) Educated Patient    Methods Explanation;Demonstration;Tactile cues;Verbal cues;Handout     Comprehension Verbalized understanding;Returned demonstration;Verbal cues required;Tactile cues required;Need further instruction              PT Short Term Goals - 11/14/21 1337       PT SHORT TERM GOAL #1   Title Be independent with initial home exercise program for self-management of symptoms.    Baseline Initial HEP provided at IE (11/14/2021);    Time 3    Period Weeks    Status New    Target Date 11/28/21               PT Long Term Goals - 11/14/21 1337       PT LONG TERM GOAL #1   Title Be independent with a long-term home exercise program for self-management of symptoms.    Baseline Initial HEP provided at IE (11/14/2021);    Time 12    Period Weeks    Status New   TARGET DATE FOR ALL LONG TERM GOALS: 02/06/2022     PT LONG TERM GOAL #2   Title Demonstrate improved FOTO to equal or greater than 67 by visit #14 to demonstrate improvement in overall condition and self-reported functional ability.    Baseline 47 (11/14/2021);    Time 12    Period Weeks    Status New      PT LONG TERM GOAL #3   Title Patient will demonstrate L ankle DF and PF  ROM equal or greater than R ankle ROM with no increase in pain to improve gait pattern and improve ability to ambulate at work.    Baseline L LE  significantly lacking - see objective exam (11/14/2021);    Time 12    Period Weeks    Status New      PT LONG TERM GOAL #4   Title Patient will demonstrate ability to perform 20 single leg heel raises with no increase pain on L LE for improved toe-off with gait over 12-hour work shift.    Baseline MMT 4/5 in seated position (11/14/2021);    Time 12    Period Weeks    Status New      PT LONG TERM GOAL #5   Title Complete community, work and/or recreational activities without limitation due to current condition.    Baseline Functional Limitations: difficulty with weight bearing activities such as walking, standing, pushing, pulling, lifting, working as a Chartered certified accountant, Control and instrumentation engineer,  social participation, hobbies, household and community mobility, going up hills, navigating stairs, etc (11/14/2021);    Time 12    Period Weeks    Status New                    Plan - 11/14/21 1332     Clinical Impression Statement Patient is a 51 y.o. female referred to outpatient physical therapy with a medical diagnosis of achilles tendon pain who presents with signs and symptoms consistent with acute on chronic L achilles tendon pain most likely related to left insertional achilles tendionpathy. Patient presents with significant pain, ROM, posture, joint stiffness, flexibility, muscle tension, muscle performance (strength/power/endurance) and activity tolerance impairments that are limiting ability to complete weight bearing activities such as walking, standing, pushing, pulling, lifting, working as a Chartered certified accountant, Control and instrumentation engineer, social participation, hobbies, household and community mobility, going up stairs without difficulty and decreases her quality of life. She is currently out of work due to the severity of her pain/dysfunction. Patient will benefit from skilled physical therapy intervention to address current body structure impairments and activity limitations to improve function and work towards goals set in current POC in order to return to prior level of function or maximal functional improvement.    Personal Factors and Comorbidities Comorbidity 3+;Past/Current Experience;Time since onset of injury/illness/exacerbation;Profession    Comorbidities Relevant past medical history and comorbidities include bilateral plantar fasciitis, low back pain without sciatica, obesity    Examination-Activity Limitations Lift;Stairs;Locomotion Level;Stand;Caring for Others;Carry;Sleep;Transfers    Examination-Participation Restrictions Community Activity;Occupation;Interpersonal Relationship   difficulty with weight bearing activities such as walking, standing, pushing, pulling, lifting, working as a  Chartered certified accountant, Control and instrumentation engineer, social participation, hobbies, household and community mobility, going up hills, navigating stairs, etc.   Stability/Clinical Decision Making Evolving/Moderate complexity    Clinical Decision Making Moderate    Rehab Potential Good    PT Frequency 2x / week    PT Duration 12 weeks    PT Treatment/Interventions ADLs/Self Care Home Management;Cryotherapy;Moist Heat;Electrical Stimulation;Iontophoresis 4mg /ml Dexamethasone;Ultrasound;Gait training;Therapeutic activities;Therapeutic exercise;Balance training;Neuromuscular re-education;Manual techniques;Dry needling;Passive range of motion;Spinal Manipulations;Joint Manipulations;Patient/family education    PT Next Visit Plan update HEP as appropriate, manual therapy, consider dry needling, isometric loading progressing to dynamic and strengthening progression as tolerated, intrinsic foot strengthening    PT Home Exercise Plan Medbridge Access Code: [email or web address]    Consulted and Agree with Plan of Care Patient             Patient will benefit from skilled therapeutic intervention in order to improve the following deficits and impairments:  Abnormal gait, Increased fascial restricitons, Improper body mechanics, Pain, Decreased mobility, Increased muscle spasms, Postural dysfunction, Decreased activity tolerance, Decreased endurance, Decreased range  of motion, Decreased strength, Hypomobility, Impaired perceived functional ability, Decreased balance, Difficulty walking, Obesity, Impaired flexibility  Visit Diagnosis: Pain in left ankle and joints of left foot  Other symptoms and signs involving the musculoskeletal system  Stiffness of left ankle, not elsewhere classified  Muscle weakness (generalized)     Problem List Patient Active Problem List   Diagnosis Date Noted   Post-operative state 03/18/2016   Moderate aortic valve insufficiency 02/15/2016   Everlean Alstrom. Graylon Good, PT, DPT 11/14/21, 1:45 PM   [facility] [address] [place], [address] Phone: [phone]   Fax:  [phone]  Name: Laura Schmitt MRN: [id number] Date of Birth: 11/28/1970

## 2021-11-19 ENCOUNTER — Encounter: Payer: Self-pay | Admitting: Physical Therapy

## 2021-11-19 ENCOUNTER — Other Ambulatory Visit: Payer: Self-pay

## 2021-11-19 ENCOUNTER — Ambulatory Visit: Payer: No Typology Code available for payment source | Admitting: Physical Therapy

## 2021-11-19 DIAGNOSIS — M25672 Stiffness of left ankle, not elsewhere classified: Secondary | ICD-10-CM

## 2021-11-19 DIAGNOSIS — M25572 Pain in left ankle and joints of left foot: Secondary | ICD-10-CM | POA: Diagnosis not present

## 2021-11-19 DIAGNOSIS — M6281 Muscle weakness (generalized): Secondary | ICD-10-CM

## 2021-11-19 DIAGNOSIS — R29898 Other symptoms and signs involving the musculoskeletal system: Secondary | ICD-10-CM

## 2021-11-19 NOTE — Therapy (Signed)
[facility] [address], [place], [address] Phone: [phone]   Fax:  [phone]  Physical Therapy Treatment  Patient Details  Name: JALANA MOORE MRN: [id number] Date of Birth: 02/14/1971 Referring Provider (PT): Samara Deist, DPM   Encounter Date: 11/19/2021   PT End of Session - 11/19/21 7209     Visit Number 2    Number of Visits 24    Date for PT Re-Evaluation 02/06/22    Authorization Type [facility] [employer]OC[employer] reporting period from 11/14/2021    Progress Note Due on Visit 10    PT Start Time 0900    PT Stop Time 0940    PT Time Calculation (min) 40 min    Activity Tolerance Patient tolerated treatment well;Patient limited by pain    Behavior During Therapy [facility] for tasks assessed/performed             Past Medical History:  Diagnosis Date   Anemia    Cervical dysplasia    Dysplastic nevus 05/11/2008   RLQA - mild to moderate   Dysplastic nevus 05/11/2008   RUQA - moderate, excision 06/02/2008   Dysplastic nevus 05/11/2008   L distal pretibial - excision 06/09/2008   Heart murmur    aortic valve    Hemorrhoids, external, thrombosed    Moderate aortic insufficiency     Past Surgical History:  Procedure Laterality Date   ABDOMINAL HYSTERECTOMY     BILATERAL SALPINGECTOMY Bilateral 03/18/2016   Procedure: BILATERAL SALPINGECTOMY;  Surgeon: Boykin Nearing, MD;  Location: [facility] ORS;  Service: Gynecology;  Laterality: Bilateral;   COLONOSCOPY WITH PROPOFOL N/A 10/23/2017   Procedure: COLONOSCOPY WITH PROPOFOL;  Surgeon: Lollie Sails, MD;  Location: [facility] ENDOSCOPY;  Service: Endoscopy;  Laterality: N/A;   COLONOSCOPY WITH PROPOFOL N/A 02/11/2020   Procedure: COLONOSCOPY WITH PROPOFOL;  Surgeon: Robert Bellow, MD;  Location: [facility] ENDOSCOPY;  Service: Endoscopy;  Laterality: N/A;   ESOPHAGOGASTRODUODENOSCOPY (EGD) WITH PROPOFOL N/A 02/11/2020   Procedure:  ESOPHAGOGASTRODUODENOSCOPY (EGD) WITH PROPOFOL;  Surgeon: Robert Bellow, MD;  Location: [facility] ENDOSCOPY;  Service: Endoscopy;  Laterality: N/A;   I&D THROMBOSED HEMORRHOID  01/05/2008   LEEP  08/14/2010   Lt ear cyst     SINUS EXPLORATION     VAGINAL HYSTERECTOMY N/A 03/18/2016   Procedure: HYSTERECTOMY VAGINAL;  Surgeon: Boykin Nearing, MD;  Location: [facility] ORS;  Service: Gynecology;  Laterality: N/A;    There were no vitals filed for this visit.   Subjective Assessment - 11/19/21 0902     Subjective Patient reports her left achilles region is about the same. She feels that she is tolerating the HEP okay. She went to [facility] for 30-40 min of walking around before it started hurting more and she had to sit down while her husband was looking at stuff. Once she got home and got off of it eased up quickly but it hurts again when she is on it. She does not have pain in her L or achilles or foot upon arrival. She is having some pain she gets periodically in her upper gastric region she thinks is related to a hiatial or umbilical hernia.    Pertinent History Patient is a 51 y.o. female who presents to outpatient physical therapy with a referral for medical diagnosis achilles tendon pain. This patient's chief complaints consist of left sided pain over the achilles tendon that radiates towards the knee in the setting of chronic plantar fascia  pain leading to the following functional deficits: difficulty with weight bearing activities such as walking, standing, pushing, pulling, lifting, working as a Chartered certified accountant, Control and instrumentation engineer, social participation, hobbies, household and community mobility, going up hills, navigating stairs, etc.  Relevant past medical history and comorbidities include bilateral plantar fasciitis, low back pain without sciatica.  Patient denies hx of cancer, stroke, seizures, lung problem, major cardiac events, diabetes, unexplained weight loss, changes in bowel or bladder problems, new onset  stumbling or dropping things, spinal surgery, ankle/foot surgery, sciatic pain.    Limitations Lifting;Standing;Walking;House hold activities;Other (comment)   difficulty with weight bearing activities such as walking, standing, pushing, pulling, lifting, working as a Chartered certified accountant, Control and instrumentation engineer, social participation, hobbies, household and community mobility, going up hills, navigating stairs, etc.   Currently in Pain? No/denies              TREATMENT:    Therapeutic exercise: to centralize symptoms and improve ROM, strength, muscular endurance, and activity tolerance required for successful completion of functional activities.   CAM BOOT DOFFED   - Seated Toe splays, repeated 4 sec holds. Ball of foot and heel maintains contact with floor. To improve intrinsic foot muscle activation and strength in order to better support arch and intrinsic foot structures.  B feet 1x20  - Standing Toe Yoga: great toe extension with small toes flexion pressure into floor, repeated 4 sec holds; small toes extension with great toe flexion pressure into floor, repeated 4 sec holds. Ball of foot and heel maintains contact with floor. To improve intrinsic foot muscle activation and strength in order to better support arch and intrinsic foot structures. 1x18-20 each direction each side.  - standing DLS isometric heel raise, 4x45 seconds (partial elevation of heel), encouragement to weight bear as much on L LE as possible without increasing pain. 2 min rests between sets.  - Education on HEP including handout   Manual therapy: to reduce pain and tissue tension, improve range of motion, neuromodulation, in order to promote improved ability to complete functional activities. PRONE - STM to left triceps surae and fibularis muscles focusing on lateral head of gastroc where it is most tender and tight.    Pt required multimodal cuing for proper technique and to facilitate improved neuromuscular control, strength, range of  motion, and functional ability resulting in improved performance and form.   HOME EXERCISE PROGRAM AccAccess Code: [email or web address] URL: [email or web address] Date: 11/19/2021 Prepared by: Rosita Kea  Exercises Seated Isometric Ankle Plantarflexion - 3 x daily - 1 sets - 4 reps - 45 seconds hold - 2 minutes rest Isometric Heel Raise at Wall - 3 x daily - 4 sets - 1 reps - 45 seconds hold - 2 minutes rest Toe Spreading - 1 x daily - 1 sets - 20 reps - 4 seconds hold Seated Lesser Toes Extension - 1 x daily - 1 sets - 20 reps - 4 seconds hold Seated Great Toe Extension - 1 x daily - 1 sets - 20 reps - 4 seconds hold    PT Education - 11/19/21 0917     Education Details Exercise purpose/form. Self management techniques. Reviewed cancelation/no-show policy with patient and confirmed patient has correct phone number for clinic; patient verbalized understanding    Person(s) Educated Patient    Methods Explanation;Demonstration;Tactile cues;Verbal cues;Handout    Comprehension Verbalized understanding;Returned demonstration;Verbal cues required;Tactile cues required;Need further instruction              PT Short Term Goals - 11/19/21 1000  PT SHORT TERM GOAL #1   Title Be independent with initial home exercise program for self-management of symptoms.    Baseline Initial HEP provided at IE (11/14/2021);    Time 3    Period Weeks    Status Achieved    Target Date 11/28/21               PT Long Term Goals - 11/14/21 1337       PT LONG TERM GOAL #1   Title Be independent with a long-term home exercise program for self-management of symptoms.    Baseline Initial HEP provided at IE (11/14/2021);    Time 12    Period Weeks    Status New   TARGET DATE FOR ALL LONG TERM GOALS: 02/06/2022     PT LONG TERM GOAL #2   Title Demonstrate improved FOTO to equal or greater than 67 by visit #14 to demonstrate improvement in overall condition and self-reported functional  ability.    Baseline 47 (11/14/2021);    Time 12    Period Weeks    Status New      PT LONG TERM GOAL #3   Title Patient will demonstrate L ankle DF and PF  ROM equal or greater than R ankle ROM with no increase in pain to improve gait pattern and improve ability to ambulate at work.    Baseline L LE significantly lacking - see objective exam (11/14/2021);    Time 12    Period Weeks    Status New      PT LONG TERM GOAL #4   Title Patient will demonstrate ability to perform 20 single leg heel raises with no increase pain on L LE for improved toe-off with gait over 12-hour work shift.    Baseline MMT 4/5 in seated position (11/14/2021);    Time 12    Period Weeks    Status New      PT LONG TERM GOAL #5   Title Complete community, work and/or recreational activities without limitation due to current condition.    Baseline Functional Limitations: difficulty with weight bearing activities such as walking, standing, pushing, pulling, lifting, working as a Chartered certified accountant, Control and instrumentation engineer, social participation, hobbies, household and community mobility, going up hills, navigating stairs, etc (11/14/2021);    Time 12    Period Weeks    Status New                   Plan - 11/19/21 2671     Clinical Impression Statement Patient tolerated treatment well overall with some difficulty due to quick fatigue of L foot with intrinsic muscle exercises. She was able to progress to standing position for isometric achilles exercises on the L LE and this was updated in HEP. Manual therapy utilized to decrease tension and sensitivity in the triceps surae. Patient quite tender to palpation mostly over the lateral head of the gastroc. Patient reported no increase in symptoms by end of session. Plan to continue working on intrinsic foot strength, tendon loading, and manual therapy as appropriate next session. Also recommend global or contralateral exercise to improve blood flow. Patient would benefit from continued  management of limiting condition by skilled physical therapist to address remaining impairments and functional limitations to work towards stated goals and return to PLOF or maximal functional independence.    Personal Factors and Comorbidities Comorbidity 3+;Past/Current Experience;Time since onset of injury/illness/exacerbation;Profession    Comorbidities Relevant past medical history and comorbidities include bilateral plantar fasciitis, low  back pain without sciatica, obesity    Examination-Activity Limitations Lift;Stairs;Locomotion Level;Stand;Caring for Others;Carry;Sleep;Transfers    Examination-Participation Restrictions Community Activity;Occupation;Interpersonal Relationship   difficulty with weight bearing activities such as walking, standing, pushing, pulling, lifting, working as a Chartered certified accountant, Control and instrumentation engineer, social participation, hobbies, household and community mobility, going up hills, navigating stairs, etc.   Stability/Clinical Decision Making Evolving/Moderate complexity    Rehab Potential Good    PT Frequency 2x / week    PT Duration 12 weeks    PT Treatment/Interventions ADLs/Self Care Home Management;Cryotherapy;Moist Heat;Electrical Stimulation;Iontophoresis 4mg /ml Dexamethasone;Ultrasound;Gait training;Therapeutic activities;Therapeutic exercise;Balance training;Neuromuscular re-education;Manual techniques;Dry needling;Passive range of motion;Spinal Manipulations;Joint Manipulations;Patient/family education    PT Next Visit Plan update HEP as appropriate, manual therapy, consider dry needling, isometric loading progressing to dynamic and strengthening progression as tolerated, intrinsic foot strengthening    PT Home Exercise Plan Medbridge Access Code: [email or web address]    Consulted and Agree with Plan of Care Patient             Patient will benefit from skilled therapeutic intervention in order to improve the following deficits and impairments:  Abnormal gait, Increased fascial  restricitons, Improper body mechanics, Pain, Decreased mobility, Increased muscle spasms, Postural dysfunction, Decreased activity tolerance, Decreased endurance, Decreased range of motion, Decreased strength, Hypomobility, Impaired perceived functional ability, Decreased balance, Difficulty walking, Obesity, Impaired flexibility  Visit Diagnosis: Pain in left ankle and joints of left foot  Other symptoms and signs involving the musculoskeletal system  Stiffness of left ankle, not elsewhere classified  Muscle weakness (generalized)     Problem List Patient Active Problem List   Diagnosis Date Noted   Post-operative state 03/18/2016   Moderate aortic valve insufficiency 02/15/2016    Everlean Alstrom. Graylon Good, PT, DPT 11/19/21, 10:00 AM   [facility] [address] [place], [address] Phone: [phone]   Fax:  [phone]  Name: TOMIE SPIZZIRRI MRN: [id number] Date of Birth: 05/28/1971

## 2021-11-21 ENCOUNTER — Other Ambulatory Visit: Payer: Self-pay

## 2021-11-21 ENCOUNTER — Encounter: Payer: Self-pay | Admitting: Physical Therapy

## 2021-11-21 ENCOUNTER — Ambulatory Visit: Payer: No Typology Code available for payment source | Admitting: Physical Therapy

## 2021-11-21 DIAGNOSIS — R29898 Other symptoms and signs involving the musculoskeletal system: Secondary | ICD-10-CM

## 2021-11-21 DIAGNOSIS — M25572 Pain in left ankle and joints of left foot: Secondary | ICD-10-CM | POA: Diagnosis not present

## 2021-11-21 DIAGNOSIS — M25672 Stiffness of left ankle, not elsewhere classified: Secondary | ICD-10-CM

## 2021-11-21 DIAGNOSIS — M6281 Muscle weakness (generalized): Secondary | ICD-10-CM

## 2021-11-21 NOTE — Therapy (Signed)
[facility] [address], [place], [address] Phone: [phone]   Fax:  [phone]  Physical Therapy Treatment  Patient Details  Name: Laura Schmitt MRN: [id number] Date of Birth: [birth year] Referring Provider (PT): Samara Deist, DPM   Encounter Date: 11/21/2021   PT End of Session - 11/21/21 1544     Visit Number 3    Number of Visits 24    Date for PT Re-Evaluation 02/06/22    Authorization Type [employer] reporting period from 11/14/2021    Progress Note Due on Visit 10    PT Start Time 1520    PT Stop Time 1600    PT Time Calculation (min) 40 min    Activity Tolerance Patient tolerated treatment well;Patient limited by pain    Behavior During Therapy [facility] for tasks assessed/performed             Past Medical History:  Diagnosis Date   Anemia    Cervical dysplasia    Dysplastic nevus 05/11/2008   RLQA - mild to moderate   Dysplastic nevus 05/11/2008   RUQA - moderate, excision 06/02/2008   Dysplastic nevus 05/11/2008   L distal pretibial - excision 06/09/2008   Heart murmur    aortic valve    Hemorrhoids, external, thrombosed    Moderate aortic insufficiency     Past Surgical History:  Procedure Laterality Date   ABDOMINAL HYSTERECTOMY     BILATERAL SALPINGECTOMY Bilateral 03/18/2016   Procedure: BILATERAL SALPINGECTOMY;  Surgeon: Boykin Nearing, MD;  Location: [facility] ORS;  Service: Gynecology;  Laterality: Bilateral;   COLONOSCOPY WITH PROPOFOL N/A 10/23/2017   Procedure: COLONOSCOPY WITH PROPOFOL;  Surgeon: Lollie Sails, MD;  Location: [facility]COPY;  Service: Endoscopy;  Laterality: N/A;   COLONOSCOPY WITH PROPOFOL N/A 02/11/2020   Procedure: COLONOSCOPY WITH PROPOFOL;  Surgeon: Robert Bellow, MD;  Location: [facility] ENDOSCOPY;  Service: Endoscopy;  Laterality: N/A;   ESOPHAGOGASTRODUODENOSCOPY (EGD) WITH PROPOFOL N/A 02/11/2020   Procedure:  ESOPHAGOGASTRODUODENOSCOPY (EGD) WITH PROPOFOL;  Surgeon: Robert Bellow, MD;  Location: [facility] ENDOSCOPY;  Service: Endoscopy;  Laterality: N/A;   I&D THROMBOSED HEMORRHOID  01/05/2008   LEEP  08/14/2010   Lt ear cyst     SINUS EXPLORATION     VAGINAL HYSTERECTOMY N/A 03/18/2016   Procedure: HYSTERECTOMY VAGINAL;  Surgeon: Boykin Nearing, MD;  Location: [facility] ORS;  Service: Gynecology;  Laterality: N/A;    There were no vitals filed for this visit.   Subjective Assessment - 11/21/21 1527     Subjective Patient arrived with no CAM boot. She states she was trying to go without it today because she is tired of wearing it. She states she has no pain when sitting but pain up to 3/10 in the left achilles tendon when walking. She states her HEP is going well but she can keep pressure on the R achilles tendon longer than the L. She uses her massager after exercise.  She felt okay after last PT session. she will work two nights in a row.    Pertinent History Patient is a 51 y.o. female who presents to outpatient physical therapy with a referral for medical diagnosis achilles tendon pain. This patient's chief complaints consist of left sided pain over the achilles tendon that radiates towards the knee in the setting of chronic plantar fascia pain leading to the following functional deficits: difficulty with weight bearing activities such as walking, standing, pushing, pulling, lifting,  working as a Chartered certified accountant, Control and instrumentation engineer, social participation, hobbies, household and community mobility, going up hills, navigating stairs, etc.  Relevant past medical history and comorbidities include bilateral plantar fasciitis, low back pain without sciatica.  Patient denies hx of cancer, stroke, seizures, lung problem, major cardiac events, diabetes, unexplained weight loss, changes in bowel or bladder problems, new onset stumbling or dropping things, spinal surgery, ankle/foot surgery, sciatic pain.    Limitations  Lifting;Standing;Walking;House hold activities;Other (comment)   difficulty with weight bearing activities such as walking, standing, pushing, pulling, lifting, working as a Chartered certified accountant, Control and instrumentation engineer, social participation, hobbies, household and community mobility, going up hills, navigating stairs, etc.   Currently in Pain? No/denies                  TREATMENT:  Not sensitive to latex   Therapeutic exercise: to centralize symptoms and improve ROM, strength, muscular endurance, and activity tolerance required for successful completion of functional activities.  - NuStep level 4 using bilateral upper and lower extremities. Seat/handle setting 8/10. For improved extremity mobility, muscular endurance, and activity tolerance; and to induce the analgesic effect of aerobic exercise, stimulate improved joint nutrition, and prepare body structures and systems for following interventions. x 6 minutes. Average SPM = 35. (Increasing pain during last 30 seconds).     SHOES DOFFED   - Seated Toe splays with mini yellow theraband loops over great and pinkie toe.  Ball of foot and heel maintains contact with floor. To improve intrinsic foot muscle activation and strength in order to better support arch and intrinsic foot structures.  3x10 each side.   - Standing great toe flexion into floor with YTB resistance, 1x10 R side (too easy), BlueTB 1x20 each side. Toes straight.  - standing lesser toe flexion in to floor with YTB resistance, 1x20 each side. Toes straight.  - standing DLS isometric heel raise, 4x45 seconds (partial elevation of heel), encouragement to weight bear as much on L LE as possible without increasing pain. 2 min rests between sets.  - Education on HEP including handout     Pt required multimodal cuing for proper technique and to facilitate improved neuromuscular control, strength, range of motion, and functional ability resulting in improved performance and form.   HOME EXERCISE  PROGRAM AccAccess Code: [email or web address] URL: [email or web address] Date: 11/19/2021 Prepared by: Rosita Kea   Exercises Seated Isometric Ankle Plantarflexion - 3 x daily - 1 sets - 4 reps - 45 seconds hold - 2 minutes rest Isometric Heel Raise at Wall - 3 x daily - 4 sets - 1 reps - 45 seconds hold - 2 minutes rest Toe Spreading - 1 x daily - 1 sets - 20 reps - 4 seconds hold Seated Lesser Toes Extension - 1 x daily - 1 sets - 20 reps - 4 seconds hold Seated Great Toe Extension - 1 x daily - 1 sets - 20 reps - 4 seconds hold      PT Education - 11/21/21 1543     Education Details Exercise purpose/form. Self management techniques.    Person(s) Educated Patient    Methods Explanation;Demonstration;Tactile cues    Comprehension Verbalized understanding;Returned demonstration;Verbal cues required;Tactile cues required;Need further instruction              PT Short Term Goals - 11/19/21 1000       PT SHORT TERM GOAL #1   Title Be independent with initial home exercise program for self-management of symptoms.    Baseline Initial HEP provided  at IE (11/14/2021);    Time 3    Period Weeks    Status Achieved    Target Date 11/28/21               PT Long Term Goals - 11/14/21 1337       PT LONG TERM GOAL #1   Title Be independent with a long-term home exercise program for self-management of symptoms.    Baseline Initial HEP provided at IE (11/14/2021);    Time 12    Period Weeks    Status New   TARGET DATE FOR ALL LONG TERM GOALS: 02/06/2022     PT LONG TERM GOAL #2   Title Demonstrate improved FOTO to equal or greater than 67 by visit #14 to demonstrate improvement in overall condition and self-reported functional ability.    Baseline 47 (11/14/2021);    Time 12    Period Weeks    Status New      PT LONG TERM GOAL #3   Title Patient will demonstrate L ankle DF and PF  ROM equal or greater than R ankle ROM with no increase in pain to improve gait pattern and  improve ability to ambulate at work.    Baseline L LE significantly lacking - see objective exam (11/14/2021);    Time 12    Period Weeks    Status New      PT LONG TERM GOAL #4   Title Patient will demonstrate ability to perform 20 single leg heel raises with no increase pain on L LE for improved toe-off with gait over 12-hour work shift.    Baseline MMT 4/5 in seated position (11/14/2021);    Time 12    Period Weeks    Status New      PT LONG TERM GOAL #5   Title Complete community, work and/or recreational activities without limitation due to current condition.    Baseline Functional Limitations: difficulty with weight bearing activities such as walking, standing, pushing, pulling, lifting, working as a Chartered certified accountant, Control and instrumentation engineer, social participation, hobbies, household and community mobility, going up hills, navigating stairs, etc (11/14/2021);    Time 12    Period Weeks    Status New                   Plan - 11/21/21 1601     Clinical Impression Statement Patient tolerated treatment well overall with no increase in pain by end of session. She did have some discomfort in the medial left plantar foot near her heel with onset at the end of the nustep but it resolved during following exercises. Progressed intrinsic foot strengthening to be band resisted and updated HEP. Plan to continue with isometrics progressing to eccentrics as well as foot strengthening and proprioception as tolerated. Patient would benefit from continued management of limiting condition by skilled physical therapist to address remaining impairments and functional limitations to work towards stated goals and return to PLOF or maximal functional independence.    Personal Factors and Comorbidities Comorbidity 3+;Past/Current Experience;Time since onset of injury/illness/exacerbation;Profession    Comorbidities Relevant past medical history and comorbidities include bilateral plantar fasciitis, low back pain without  sciatica, obesity    Examination-Activity Limitations Lift;Stairs;Locomotion Level;Stand;Caring for Others;Carry;Sleep;Transfers    Examination-Participation Restrictions Community Activity;Occupation;Interpersonal Relationship   difficulty with weight bearing activities such as walking, standing, pushing, pulling, lifting, working as a Chartered certified accountant, Control and instrumentation engineer, social participation, hobbies, household and community mobility, going up hills, navigating stairs, etc.   Stability/Clinical Decision  Making Evolving/Moderate complexity    Rehab Potential Good    PT Frequency 2x / week    PT Duration 12 weeks    PT Treatment/Interventions ADLs/Self Care Home Management;Cryotherapy;Moist Heat;Electrical Stimulation;Iontophoresis 4mg /ml Dexamethasone;Ultrasound;Gait training;Therapeutic activities;Therapeutic exercise;Balance training;Neuromuscular re-education;Manual techniques;Dry needling;Passive range of motion;Spinal Manipulations;Joint Manipulations;Patient/family education    PT Next Visit Plan update HEP as appropriate, manual therapy, consider dry needling, isometric loading progressing to dynamic and strengthening progression as tolerated, intrinsic foot strengthening    PT Home Exercise Plan Medbridge Access Code: [email or web address]    Consulted and Agree with Plan of Care Patient             Patient will benefit from skilled therapeutic intervention in order to improve the following deficits and impairments:  Abnormal gait, Increased fascial restricitons, Improper body mechanics, Pain, Decreased mobility, Increased muscle spasms, Postural dysfunction, Decreased activity tolerance, Decreased endurance, Decreased range of motion, Decreased strength, Hypomobility, Impaired perceived functional ability, Decreased balance, Difficulty walking, Obesity, Impaired flexibility  Visit Diagnosis: Pain in left ankle and joints of left foot  Other symptoms and signs involving the musculoskeletal system  Stiffness of  left ankle, not elsewhere classified  Muscle weakness (generalized)     Problem List Patient Active Problem List   Diagnosis Date Noted   Post-operative state 03/18/2016   Moderate aortic valve insufficiency 02/15/2016    Everlean Alstrom. Graylon Good, PT, DPT 11/21/21, 4:02 PM  [facility] [address] [place], [address] Phone: [phone]   Fax:  [phone]  Name: LYRIK DOCKSTADER MRN: [id number] Date of Birth: 02/06/1971

## 2021-11-26 ENCOUNTER — Other Ambulatory Visit: Payer: Self-pay

## 2021-11-26 ENCOUNTER — Encounter: Payer: Self-pay | Admitting: Physical Therapy

## 2021-11-26 ENCOUNTER — Ambulatory Visit: Payer: No Typology Code available for payment source | Admitting: Physical Therapy

## 2021-11-26 DIAGNOSIS — M25672 Stiffness of left ankle, not elsewhere classified: Secondary | ICD-10-CM

## 2021-11-26 DIAGNOSIS — M25572 Pain in left ankle and joints of left foot: Secondary | ICD-10-CM | POA: Diagnosis not present

## 2021-11-26 DIAGNOSIS — M6281 Muscle weakness (generalized): Secondary | ICD-10-CM

## 2021-11-26 DIAGNOSIS — R29898 Other symptoms and signs involving the musculoskeletal system: Secondary | ICD-10-CM

## 2021-11-26 NOTE — Therapy (Signed)
**Note Laura-Identified via Obfuscation** [facility] [address], [place], [address] Phone: [phone]   Fax:  [phone]  Physical Therapy Treatment  Patient Details  Name: Laura Schmitt MRN: [id number] Date of Birth: 06-14-1971 Referring Provider (PT): Samara Deist, DPM   Encounter Date: 11/26/2021   PT End of Session - 11/26/21 0955     Visit Number 4    Number of Visits 24    Date for PT Re-Evaluation 02/06/22    Authorization Type [place] [employer]OCUS reporting period from 11/14/2021    Progress Note Due on Visit 10    PT Start Time 0950    PT Stop Time 1028    PT Time Calculation (min) 38 min    Activity Tolerance Patient tolerated treatment well;Patient limited by pain    Behavior During Therapy [facility] for tasks assessed/performed             Past Medical History:  Diagnosis Date   Anemia    Cervical dysplasia    Dysplastic nevus 05/11/2008   RLQA - mild to moderate   Dysplastic nevus 05/11/2008   RUQA - moderate, excision 06/02/2008   Dysplastic nevus 05/11/2008   L distal pretibial - excision 06/09/2008   Heart murmur    aortic valve    Hemorrhoids, external, thrombosed    Moderate aortic insufficiency     Past Surgical History:  Procedure Laterality Date   ABDOMINAL HYSTERECTOMY     BILATERAL SALPINGECTOMY Bilateral 03/18/2016   Procedure: BILATERAL SALPINGECTOMY;  Surgeon: Boykin Nearing, MD;  Location: [facility] ORS;  Service: Gynecology;  Laterality: Bilateral;   COLONOSCOPY WITH PROPOFOL N/A 10/23/2017   Procedure: COLONOSCOPY WITH PROPOFOL;  Surgeon: Lollie Sails, MD;  Location: [facility] ENDOSCOPY;  Service: Endoscopy;  Laterality: N/A;   COLONOSCOPY WITH PROPOFOL N/A 02/11/2020   Procedure: COLONOSCOPY WITH PROPOFOL;  Surgeon: Robert Bellow, MD;  Location: [facility] ENDOSCOPY;  Service: Endoscopy;  Laterality: N/A;   ESOPHAGOGASTRODUODENOSCOPY (EGD) WITH PROPOFOL N/A 02/11/2020   Procedure:  ESOPHAGOGASTRODUODENOSCOPY (EGD) WITH PROPOFOL;  Surgeon: Robert Bellow, MD;  Location: [facility] ENDOSCOPY;  Service: Endoscopy;  Laterality: N/A;   I&D THROMBOSED HEMORRHOID  01/05/2008   LEEP  08/14/2010   Lt ear cyst     SINUS EXPLORATION     VAGINAL HYSTERECTOMY N/A 03/18/2016   Procedure: HYSTERECTOMY VAGINAL;  Surgeon: Boykin Nearing, MD;  Location: [facility] ORS;  Service: Gynecology;  Laterality: N/A;    There were no vitals filed for this visit.   Subjective Assessment - 11/26/21 0952     Subjective Patient reports she has no pain currently. She arrives in athletic shoes on both feet. She states her L achilles tendon/calf region started hurting at aobut 2 am or 11pm on Thursday and Friday night, respetively while working her shift that started at 7 in the cam boot.  She usually works 3 nights a week. She will work sat, sun, mon this weekend which usual aggrevates her plantar fasciitis. She states she gets cramping in her toes about 30 min after she does her toe exercises. She felt okay after last PT session. Her HEP is going well and she is participating.    Pertinent History Patient is a 51 y.o. female who presents to outpatient physical therapy with a referral for medical diagnosis achilles tendon pain. This patient's chief complaints consist of left sided pain over the achilles tendon that radiates towards the knee in the setting of chronic plantar fascia pain leading  to the following functional deficits: difficulty with weight bearing activities such as walking, standing, pushing, pulling, lifting, working as a Chartered certified accountant, Control and instrumentation engineer, social participation, hobbies, household and community mobility, going up hills, navigating stairs, etc.  Relevant past medical history and comorbidities include bilateral plantar fasciitis, low back pain without sciatica.  Patient denies hx of cancer, stroke, seizures, lung problem, major cardiac events, diabetes, unexplained weight loss, changes in bowel or  bladder problems, new onset stumbling or dropping things, spinal surgery, ankle/foot surgery, sciatic pain.    Limitations Lifting;Standing;Walking;House hold activities;Other (comment)   difficulty with weight bearing activities such as walking, standing, pushing, pulling, lifting, working as a Chartered certified accountant, Control and instrumentation engineer, social participation, hobbies, household and community mobility, going up hills, navigating stairs, etc.   Currently in Pain? No/denies               TREATMENT:  Not sensitive to latex   Therapeutic exercise: to centralize symptoms and improve ROM, strength, muscular endurance, and activity tolerance required for successful completion of functional activities.  - NuStep level 4 using bilateral upper and lower extremities. Seat/handle setting 8/10. For improved extremity mobility, muscular endurance, and activity tolerance; and to induce the analgesic effect of aerobic exercise, stimulate improved joint nutrition, and prepare body structures and systems for following interventions. x 5 minutes. Average SPM = 49.    SHOES DOFFED - Seated left ankle BAPS board, level 2, plantarflexion/dorsiflexion, inversion/eversion, circles clockwise, circles counter clockwise. Pt required cuing to keep knee still and instruction on how to perform exercise. 1x20 reps each direction.  - seated L foot pen/penny arch raise exercise, 2x20 with 5 second hold each side.  - standing DLS isometric heel raise, 4x45 seconds (partial elevation of heel), encouragement to weight bear as much on L LE as possible without increasing pain or getting so tired she cannot maintain the position for the full 45 seconds. 2 min rests between sets.  - Education on HEP including handout     Pt required multimodal cuing for proper technique and to facilitate improved neuromuscular control, strength, range of motion, and functional ability resulting in improved performance and form.   HOME EXERCISE PROGRAM AccAccess Code:  [email or web address] URL: [email or web address] Date: 11/19/2021 Prepared by: Rosita Kea   Exercises Seated Isometric Ankle Plantarflexion - 3 x daily - 1 sets - 4 reps - 45 seconds hold - 2 minutes rest Isometric Heel Raise at Wall - 3 x daily - 4 sets - 1 reps - 45 seconds hold - 2 minutes rest Toe Spreading - 1 x daily - 1 sets - 20 reps - 4 seconds hold Seated Lesser Toes Extension - 1 x daily - 1 sets - 20 reps - 4 seconds hold Seated Great Toe Extension - 1 x daily - 1 sets - 20 reps - 4 seconds hold   HOME EXERCISE PROGRAM [[email or web address]]  ARCH LIFT - PENNY AND PEN -  Repeat 20 Times, Hold 5 Seconds, Complete 1 Set, Perform 2 Times a Day   PT Education - 11/26/21 0955     Education Details Exercise purpose/form. Self management techniques.    Person(s) Educated Patient    Methods Explanation;Demonstration;Tactile cues;Verbal cues    Comprehension Verbalized understanding;Returned demonstration;Verbal cues required;Tactile cues required;Need further instruction              PT Short Term Goals - 11/19/21 1000       PT SHORT TERM GOAL #1   Title Be independent with initial home exercise program for self-management  of symptoms.    Baseline Initial HEP provided at IE (11/14/2021);    Time 3    Period Weeks    Status Achieved    Target Date 11/28/21               PT Long Term Goals - 11/14/21 1337       PT LONG TERM GOAL #1   Title Be independent with a long-term home exercise program for self-management of symptoms.    Baseline Initial HEP provided at IE (11/14/2021);    Time 12    Period Weeks    Status New   TARGET DATE FOR ALL LONG TERM GOALS: 02/06/2022     PT LONG TERM GOAL #2   Title Demonstrate improved FOTO to equal or greater than 67 by visit #14 to demonstrate improvement in overall condition and self-reported functional ability.    Baseline 47 (11/14/2021);    Time 12    Period Weeks    Status New      PT LONG TERM GOAL #3   Title Patient will  demonstrate L ankle DF and PF  ROM equal or greater than R ankle ROM with no increase in pain to improve gait pattern and improve ability to ambulate at work.    Baseline L LE significantly lacking - see objective exam (11/14/2021);    Time 12    Period Weeks    Status New      PT LONG TERM GOAL #4   Title Patient will demonstrate ability to perform 20 single leg heel raises with no increase pain on L LE for improved toe-off with gait over 12-hour work shift.    Baseline MMT 4/5 in seated position (11/14/2021);    Time 12    Period Weeks    Status New      PT LONG TERM GOAL #5   Title Complete community, work and/or recreational activities without limitation due to current condition.    Baseline Functional Limitations: difficulty with weight bearing activities such as walking, standing, pushing, pulling, lifting, working as a Chartered certified accountant, Control and instrumentation engineer, social participation, hobbies, household and community mobility, going up hills, navigating stairs, etc (11/14/2021);    Time 12    Period Weeks    Status New                   Plan - 11/26/21 1022     Clinical Impression Statement Patient tolerated treatment well overall with mild discomfort towards end of some exercise sets but no increase in pain by end of session. Continued to focus on isometric loading of achilles tendon as well as intrinsic foot and motor control exercises. Plan to continue with tendon loading progression and foot strengthening and motor control as tolerated next session. Patient was able to return to work for two shifts but continues to be limited by pain with weight bearing activities. Patient would benefit from continued management of limiting condition by skilled physical therapist to address remaining impairments and functional limitations to work towards stated goals and return to PLOF or maximal functional independence.    Personal Factors and Comorbidities Comorbidity 3+;Past/Current Experience;Time since onset of  injury/illness/exacerbation;Profession    Comorbidities Relevant past medical history and comorbidities include bilateral plantar fasciitis, low back pain without sciatica, obesity    Examination-Activity Limitations Lift;Stairs;Locomotion Level;Stand;Caring for Others;Carry;Sleep;Transfers    Examination-Participation Restrictions Community Activity;Occupation;Interpersonal Relationship   difficulty with weight bearing activities such as walking, standing, pushing, pulling, lifting, working as a Chartered certified accountant,  baking, social participation, hobbies, household and community mobility, going up hills, navigating stairs, etc.   Stability/Clinical Decision Making Evolving/Moderate complexity    Rehab Potential Good    PT Frequency 2x / week    PT Duration 12 weeks    PT Treatment/Interventions ADLs/Self Care Home Management;Cryotherapy;Moist Heat;Electrical Stimulation;Iontophoresis 4mg /ml Dexamethasone;Ultrasound;Gait training;Therapeutic activities;Therapeutic exercise;Balance training;Neuromuscular re-education;Manual techniques;Dry needling;Passive range of motion;Spinal Manipulations;Joint Manipulations;Patient/family education    PT Next Visit Plan update HEP as appropriate, manual therapy, consider dry needling, isometric loading progressing to dynamic and strengthening progression as tolerated, intrinsic foot strengthening    PT Home Exercise Plan Medbridge Access Code: [email or web address]    Consulted and Agree with Plan of Care Patient             Patient will benefit from skilled therapeutic intervention in order to improve the following deficits and impairments:  Abnormal gait, Increased fascial restricitons, Improper body mechanics, Pain, Decreased mobility, Increased muscle spasms, Postural dysfunction, Decreased activity tolerance, Decreased endurance, Decreased range of motion, Decreased strength, Hypomobility, Impaired perceived functional ability, Decreased balance, Difficulty walking, Obesity,  Impaired flexibility  Visit Diagnosis: Pain in left ankle and joints of left foot  Other symptoms and signs involving the musculoskeletal system  Stiffness of left ankle, not elsewhere classified  Muscle weakness (generalized)     Problem List Patient Active Problem List   Diagnosis Date Noted   Post-operative state 03/18/2016   Moderate aortic valve insufficiency 02/15/2016    Everlean Alstrom. Graylon Good, PT, DPT 11/26/21, 10:27 AM   [facility] [address], [place], [address] Phone: [phone]   Fax:  [phone]  Name: Laura Schmitt MRN: [id number] Date of Birth: Nov 21, 1970

## 2021-11-29 ENCOUNTER — Other Ambulatory Visit: Payer: Self-pay

## 2021-11-29 ENCOUNTER — Ambulatory Visit: Payer: No Typology Code available for payment source | Attending: Podiatry | Admitting: Physical Therapy

## 2021-11-29 DIAGNOSIS — M25572 Pain in left ankle and joints of left foot: Secondary | ICD-10-CM | POA: Insufficient documentation

## 2021-11-29 DIAGNOSIS — M6281 Muscle weakness (generalized): Secondary | ICD-10-CM | POA: Insufficient documentation

## 2021-11-29 DIAGNOSIS — R29898 Other symptoms and signs involving the musculoskeletal system: Secondary | ICD-10-CM | POA: Diagnosis present

## 2021-11-29 DIAGNOSIS — M25672 Stiffness of left ankle, not elsewhere classified: Secondary | ICD-10-CM | POA: Insufficient documentation

## 2021-11-29 NOTE — Therapy (Signed)
[facility] [address] [place], [address] Phone: [phone]   Fax:  [phone]  Physical Therapy Treatment  Patient Details  Name: Laura Schmitt MRN: [id number] Date of Birth: Jun 28, 1971 Referring Provider (PT): Samara Deist, DPM   Encounter Date: 11/29/2021   PT End of Session - 11/29/21 1133     Visit Number 5    Number of Visits 24    Date for PT Re-Evaluation 02/06/22    Authorization Type [employer] reporting period from 11/14/2021    Progress Note Due on Visit 10    PT Start Time 1125    PT Stop Time 1205    PT Time Calculation (min) 40 min    Activity Tolerance Patient tolerated treatment well;Patient limited by pain    Behavior During Therapy [facility] for tasks assessed/performed             Past Medical History:  Diagnosis Date   Anemia    Cervical dysplasia    Dysplastic nevus 05/11/2008   RLQA - mild to moderate   Dysplastic nevus 05/11/2008   RUQA - moderate, excision 06/02/2008   Dysplastic nevus 05/11/2008   L distal pretibial - excision 06/09/2008   Heart murmur    aortic valve    Hemorrhoids, external, thrombosed    Moderate aortic insufficiency     Past Surgical History:  Procedure Laterality Date   ABDOMINAL HYSTERECTOMY     BILATERAL SALPINGECTOMY Bilateral 03/18/2016   Procedure: BILATERAL SALPINGECTOMY;  Surgeon: Boykin Nearing, MD;  Location: [facility] ORS;  Service: Gynecology;  Laterality: Bilateral;   COLONOSCOPY WITH PROPOFOL N/A 10/23/2017   Procedure: COLONOSCOPY WITH PROPOFOL;  Surgeon: Lollie Sails, MD;  Location: [facility];  Service: Endoscopy;  Laterality: N/A;   COLONOSCOPY WITH PROPOFOL N/A 02/11/2020   Procedure: COLONOSCOPY WITH PROPOFOL;  Surgeon: Robert Bellow, MD;  Location: [facility] ENDOSCOPY;  Service: Endoscopy;  Laterality: N/A;   ESOPHAGOGASTRODUODENOSCOPY (EGD) WITH PROPOFOL N/A 02/11/2020   Procedure:  ESOPHAGOGASTRODUODENOSCOPY (EGD) WITH PROPOFOL;  Surgeon: Robert Bellow, MD;  Location: [facility] ENDOSCOPY;  Service: Endoscopy;  Laterality: N/A;   I&D THROMBOSED HEMORRHOID  01/05/2008   LEEP  08/14/2010   Lt ear cyst     SINUS EXPLORATION     VAGINAL HYSTERECTOMY N/A 03/18/2016   Procedure: HYSTERECTOMY VAGINAL;  Surgeon: Boykin Nearing, MD;  Location: [facility] ORS;  Service: Gynecology;  Laterality: N/A;    There were no vitals filed for this visit.   Subjective Assessment - 11/29/21 1129     Subjective Patient reports her left foot/ankle did not hurt while working on Tuesday or Wednesday night while wearing the boot. She did have pain up to 4/10 in the right foot. She goes back to Dr. Vickki Muff on 12/06/2021. She currently has pain of 2/10 in the R plantar surface of foot. She was busy but not bad both nights. HEP is going really good. Her L foot still gets to tired to stand on only one foot with the isometric exercise. Her feet were tired after last PT session. She is still getting cramping in her toes after the banded toe exercises. She is doing all of her HEP on both feet.    Pertinent History Patient is a 51 y.o. female who presents to outpatient physical therapy with a referral for medical diagnosis achilles tendon pain. This patient's chief complaints consist of left sided pain over the achilles tendon that radiates towards the knee  in the setting of chronic plantar fascia pain leading to the following functional deficits: difficulty with weight bearing activities such as walking, standing, pushing, pulling, lifting, working as a Chartered certified accountant, Control and instrumentation engineer, social participation, hobbies, household and community mobility, going up hills, navigating stairs, etc.  Relevant past medical history and comorbidities include bilateral plantar fasciitis, low back pain without sciatica.  Patient denies hx of cancer, stroke, seizures, lung problem, major cardiac events, diabetes, unexplained weight loss, changes  in bowel or bladder problems, new onset stumbling or dropping things, spinal surgery, ankle/foot surgery, sciatic pain.    Limitations Lifting;Standing;Walking;House hold activities;Other (comment)   difficulty with weight bearing activities such as walking, standing, pushing, pulling, lifting, working as a Chartered certified accountant, Control and instrumentation engineer, social participation, hobbies, household and community mobility, going up hills, navigating stairs, etc.   Currently in Pain? Yes    Pain Score 2              OBJECTIVE  SELF-REPORTED FUNCTION FOTO score: 58/100 (100 questionnaire)   TREATMENT:  Not sensitive to latex   Therapeutic exercise: to centralize symptoms and improve ROM, strength, muscular endurance, and activity tolerance required for successful completion of functional activities.  - NuStep level 5 using bilateral upper and lower extremities. Seat/handle setting 8/10. For improved extremity mobility, muscular endurance, and activity tolerance; and to induce the analgesic effect of aerobic exercise, stimulate improved joint nutrition, and prepare body structures and systems for following interventions. x 5 minutes. Average SPM = 60.    SHOES DOFFED - Seated ankle BAPS board, level 3, plantarflexion/dorsiflexion, inversion/eversion, circles clockwise, circles counter clockwise. Pt required cuing to keep knee still and instruction on how to perform exercise. 1x20 reps each direction each side.  - SLS balance with 5# DB pass side to side, 3x10 passes each side.  - standing DLS isometric heel raise, 4x45 seconds (partial elevation of heel), encouragement to weight bear as much on L LE as possible without increasing pain or getting so tired she cannot maintain the position for the full 45 seconds. 2 min rests between sets. attempted full shift to L foot but had increased pain and unable to hold the last 2 seconds. Last set had sharp pain at top of left foot that was immediately relieved by lowering heel.  -  Education on HEP including handout     Pt required multimodal cuing for proper technique and to facilitate improved neuromuscular control, strength, range of motion, and functional ability resulting in improved performance and form.   HOME EXERCISE PROGRAM Access Code: [email or web address] URL: [email or web address] Date: 11/29/2021 Prepared by: Rosita Kea  Exercises Seated Isometric Ankle Plantarflexion - 3 x daily - 1 sets - 4 reps - 45 seconds hold - 2 minutes rest Isometric Heel Raise at Wall - 3 x daily - 4 sets - 1 reps - 45 seconds hold - 2 minutes rest Toe Spreading - 1 x daily - 1 sets - 20 reps - 4 seconds hold Seated Lesser Toes Extension - 1 x daily - 1 sets - 20 reps - 4 seconds hold Seated Great Toe Extension - 1 x daily - 1 sets - 20 reps - 4 seconds hold Single Leg Stance - 1 x daily - 3 sets - 10 reps   HOME EXERCISE PROGRAM [[id number]]   ARCH LIFT - PENNY AND PEN -  Repeat 20 Times, Hold 5 Seconds, Complete 1 Set, Perform 2 Times a Day     PT Education - 11/29/21 1133     Education  Details Exercise purpose/form. Self management techniques.    Person(s) Educated Patient    Methods Explanation;Demonstration;Tactile cues;Verbal cues    Comprehension Verbalized understanding;Returned demonstration;Verbal cues required;Tactile cues required;Need further instruction              PT Short Term Goals - 11/19/21 1000       PT SHORT TERM GOAL #1   Title Be independent with initial home exercise program for self-management of symptoms.    Baseline Initial HEP provided at IE (11/14/2021);    Time 3    Period Weeks    Status Achieved    Target Date 11/28/21               PT Long Term Goals - 11/14/21 1337       PT LONG TERM GOAL #1   Title Be independent with a long-term home exercise program for self-management of symptoms.    Baseline Initial HEP provided at IE (11/14/2021);    Time 12    Period Weeks    Status New   TARGET DATE FOR ALL LONG TERM  GOALS: 02/06/2022     PT LONG TERM GOAL #2   Title Demonstrate improved FOTO to equal or greater than 67 by visit #14 to demonstrate improvement in overall condition and self-reported functional ability.    Baseline 47 (11/14/2021);    Time 12    Period Weeks    Status New      PT LONG TERM GOAL #3   Title Patient will demonstrate L ankle DF and PF  ROM equal or greater than R ankle ROM with no increase in pain to improve gait pattern and improve ability to ambulate at work.    Baseline L LE significantly lacking - see objective exam (11/14/2021);    Time 12    Period Weeks    Status New      PT LONG TERM GOAL #4   Title Patient will demonstrate ability to perform 20 single leg heel raises with no increase pain on L LE for improved toe-off with gait over 12-hour work shift.    Baseline MMT 4/5 in seated position (11/14/2021);    Time 12    Period Weeks    Status New      PT LONG TERM GOAL #5   Title Complete community, work and/or recreational activities without limitation due to current condition.    Baseline Functional Limitations: difficulty with weight bearing activities such as walking, standing, pushing, pulling, lifting, working as a Chartered certified accountant, Control and instrumentation engineer, social participation, hobbies, household and community mobility, going up hills, navigating stairs, etc (11/14/2021);    Time 12    Period Weeks    Status New                   Plan - 11/29/21 1546     Clinical Impression Statement Patient tolerated treatment well overall and continues to report improvements in pain and function. She is getting closer to being able to stand on one leg for isometric exercises but her feet continue to fatigue quickly. She is also still limited by R foot pain on the plantar surface. FOTO score shows improvement from initial eval but not yet to predicted improvement. Plan to continue working on foot and ankle strength and stability with tendon loading progression central to interventions.  Patient would benefit from continued management of limiting condition by skilled physical therapist to address remaining impairments and functional limitations to work towards stated goals and return  to PLOF or maximal functional independence.    Personal Factors and Comorbidities Comorbidity 3+;Past/Current Experience;Time since onset of injury/illness/exacerbation;Profession    Comorbidities Relevant past medical history and comorbidities include bilateral plantar fasciitis, low back pain without sciatica, obesity    Examination-Activity Limitations Lift;Stairs;Locomotion Level;Stand;Caring for Others;Carry;Sleep;Transfers    Examination-Participation Restrictions Community Activity;Occupation;Interpersonal Relationship   difficulty with weight bearing activities such as walking, standing, pushing, pulling, lifting, working as a Chartered certified accountant, Control and instrumentation engineer, social participation, hobbies, household and community mobility, going up hills, navigating stairs, etc.   Stability/Clinical Decision Making Evolving/Moderate complexity    Rehab Potential Good    PT Frequency 2x / week    PT Duration 12 weeks    PT Treatment/Interventions ADLs/Self Care Home Management;Cryotherapy;Moist Heat;Electrical Stimulation;Iontophoresis 4mg /ml Dexamethasone;Ultrasound;Gait training;Therapeutic activities;Therapeutic exercise;Balance training;Neuromuscular re-education;Manual techniques;Dry needling;Passive range of motion;Spinal Manipulations;Joint Manipulations;Patient/family education    PT Next Visit Plan update HEP as appropriate, manual therapy, consider dry needling, isometric loading progressing to dynamic and strengthening progression as tolerated, intrinsic foot strengthening    PT Home Exercise Plan Medbridge Access Code: [email or web address]    Consulted and Agree with Plan of Care Patient             Patient will benefit from skilled therapeutic intervention in order to improve the following deficits and impairments:   Abnormal gait, Increased fascial restricitons, Improper body mechanics, Pain, Decreased mobility, Increased muscle spasms, Postural dysfunction, Decreased activity tolerance, Decreased endurance, Decreased range of motion, Decreased strength, Hypomobility, Impaired perceived functional ability, Decreased balance, Difficulty walking, Obesity, Impaired flexibility  Visit Diagnosis: Pain in left ankle and joints of left foot  Other symptoms and signs involving the musculoskeletal system  Stiffness of left ankle, not elsewhere classified  Muscle weakness (generalized)     Problem List Patient Active Problem List   Diagnosis Date Noted   Post-operative state 03/18/2016   Moderate aortic valve insufficiency 02/15/2016    Everlean Alstrom. Graylon Good, PT, DPT 11/29/21, 3:47 PM   [facility] [address] [place], [address] Phone: [phone]   Fax:  [phone]  Name: Laura Schmitt MRN: [id number] Date of Birth: 05/18/1971

## 2021-12-04 ENCOUNTER — Ambulatory Visit: Payer: No Typology Code available for payment source | Admitting: Physical Therapy

## 2021-12-05 ENCOUNTER — Encounter: Payer: Self-pay | Admitting: Physical Therapy

## 2021-12-05 ENCOUNTER — Other Ambulatory Visit: Payer: Self-pay

## 2021-12-05 ENCOUNTER — Ambulatory Visit: Payer: No Typology Code available for payment source | Admitting: Physical Therapy

## 2021-12-05 DIAGNOSIS — M25672 Stiffness of left ankle, not elsewhere classified: Secondary | ICD-10-CM

## 2021-12-05 DIAGNOSIS — R29898 Other symptoms and signs involving the musculoskeletal system: Secondary | ICD-10-CM

## 2021-12-05 DIAGNOSIS — M6281 Muscle weakness (generalized): Secondary | ICD-10-CM

## 2021-12-05 DIAGNOSIS — M25572 Pain in left ankle and joints of left foot: Secondary | ICD-10-CM | POA: Diagnosis not present

## 2021-12-05 NOTE — Therapy (Signed)
[facility] [address] [place], [address] Phone: [phone]   Fax:  [phone]  Physical Therapy Treatment  Patient Details  Name: Laura Schmitt MRN: [id number] Date of Birth: April 18, 1971 Referring Provider (PT): Samara Deist, DPM   Encounter Date: 12/05/2021   PT End of Session - 12/05/21 1132     Visit Number 6    Number of Visits 24    Date for PT Re-Evaluation 02/06/22    Authorization Type [employer] reporting period from 11/14/2021    Progress Note Due on Visit 10    PT Start Time 1125    PT Stop Time 1205    PT Time Calculation (min) 40 min    Activity Tolerance Patient tolerated treatment well;Patient limited by pain    Behavior During Therapy [facility] for tasks assessed/performed             Past Medical History:  Diagnosis Date   Anemia    Cervical dysplasia    Dysplastic nevus 05/11/2008   RLQA - mild to moderate   Dysplastic nevus 05/11/2008   RUQA - moderate, excision 06/02/2008   Dysplastic nevus 05/11/2008   L distal pretibial - excision 06/09/2008   Heart murmur    aortic valve    Hemorrhoids, external, thrombosed    Moderate aortic insufficiency     Past Surgical History:  Procedure Laterality Date   ABDOMINAL HYSTERECTOMY     BILATERAL SALPINGECTOMY Bilateral 03/18/2016   Procedure: BILATERAL SALPINGECTOMY;  Surgeon: Boykin Nearing, MD;  Location: [facility] ORS;  Service: Gynecology;  Laterality: Bilateral;   COLONOSCOPY WITH PROPOFOL N/A 10/23/2017   Procedure: COLONOSCOPY WITH PROPOFOL;  Surgeon: Lollie Sails, MD;  Location: [facility] ENDOSCOPY;  Service: Endoscopy;  Laterality: N/A;   COLONOSCOPY WITH PROPOFOL N/A 02/11/2020   Procedure: COLONOSCOPY WITH PROPOFOL;  Surgeon: Robert Bellow, MD;  Location: [facility] ENDOSCOPY;  Service: Endoscopy;  Laterality: N/A;   ESOPHAGOGASTRODUODENOSCOPY (EGD) WITH PROPOFOL N/A 02/11/2020   Procedure:  ESOPHAGOGASTRODUODENOSCOPY (EGD) WITH PROPOFOL;  Surgeon: Robert Bellow, MD;  Location: [facility] ENDOSCOPY;  Service: Endoscopy;  Laterality: N/A;   I&D THROMBOSED HEMORRHOID  01/05/2008   LEEP  08/14/2010   Lt ear cyst     SINUS EXPLORATION     VAGINAL HYSTERECTOMY N/A 03/18/2016   Procedure: HYSTERECTOMY VAGINAL;  Surgeon: Boykin Nearing, MD;  Location: [facility] ORS;  Service: Gynecology;  Laterality: N/A;    There were no vitals filed for this visit.   Subjective Assessment - 12/05/21 1126     Subjective Patient arrives with B tennis shoes donned. She rescheduled her PT appointment yesterday morning to today because she had just finished her 3rd night shift in a row that causes severe pain in her bilateral plantar feet R > L. She wore the boot on the left foot during work. Her left achilles tendon did not give her pain. She currently has pain of 2/10 in bilateral plantar surface of feet. She wears custom orthotic inserst in her work shoes or her usual shoes if she is going to spend the day walking. She has had them for at least 2 years and wonders if she needs new ones.    Pertinent History Patient is a 51 y.o. female who presents to outpatient physical therapy with a referral for medical diagnosis achilles tendon pain. This patient's chief complaints consist of left sided pain over the achilles tendon that radiates towards the knee in the setting  of chronic plantar fascia pain leading to the following functional deficits: difficulty with weight bearing activities such as walking, standing, pushing, pulling, lifting, working as a Chartered certified accountant, Control and instrumentation engineer, social participation, hobbies, household and community mobility, going up hills, navigating stairs, etc.  Relevant past medical history and comorbidities include bilateral plantar fasciitis, low back pain without sciatica.  Patient denies hx of cancer, stroke, seizures, lung problem, major cardiac events, diabetes, unexplained weight loss, changes in  bowel or bladder problems, new onset stumbling or dropping things, spinal surgery, ankle/foot surgery, sciatic pain.    Limitations Lifting;Standing;Walking;House hold activities;Other (comment)   difficulty with weight bearing activities such as walking, standing, pushing, pulling, lifting, working as a Chartered certified accountant, Control and instrumentation engineer, social participation, hobbies, household and community mobility, going up hills, navigating stairs, etc.   Currently in Pain? Yes    Pain Score 2               OBJECTIVE   SELF-REPORTED FUNCTION FOTO score: 58/100 (100 questionnaire)     TREATMENT:  Not sensitive to latex   Therapeutic exercise: to centralize symptoms and improve ROM, strength, muscular endurance, and activity tolerance required for successful completion of functional activities.  - NuStep level 5 using bilateral upper and lower extremities. Seat/handle setting 8/10. For improved extremity mobility, muscular endurance, and activity tolerance; and to induce the analgesic effect of aerobic exercise, stimulate improved joint nutrition, and prepare body structures and systems for following interventions. x 5 minutes. Average SPM = 61.    SHOES DOFFED - standing DLS isometric heel raise, 4x45 seconds (partial elevation of heel), encouragement to weight bear as much on L LE as possible without increasing pain or getting so tired she cannot maintain the position for the full 45 seconds. At least 2 min rests between sets. - standing hip airplane modified with toe on the floor to improve foot and arch strength, 1x10 each side.  - step standing on 12 inch step, ankle dorsiflexion mobilization: 3x10 each side with 5 second hold.  PT placing posterior pressure on the talus at the front of the foot on the left side, and holding the heel down on the right side (painful with pressure to the talus). Patient reports mild pull at left musculotendinous junction region.  - standing gastroc stretch with towel roll under  toes to stretch plantar fascia, 2x 30 seconds each side.  - standing soleus stretch with towel under toes to stretch plantar fascia, 1x30 seconds each side. (Discontinued due to increasing discomfort at left achilles tendon in mid tendon region and right bottom of foot).   Manual therapy: to reduce pain and tissue tension, improve range of motion, neuromodulation, in order to promote improved ability to complete functional activities. SUPINE/LONG SITTING - talocrural joint posterior mobilization, 3x30 seconds each side, grade III-IV to improve dorsiflexion at the ankle to decrease sprain on plantar fascia while ambulating    Pt required multimodal cuing for proper technique and to facilitate improved neuromuscular control, strength, range of motion, and functional ability resulting in improved performance and form.   HOME EXERCISE PROGRAM Access Code: [email or web address] URL: [email or web address] Date: 11/29/2021 Prepared by: Rosita Kea   Exercises Seated Isometric Ankle Plantarflexion - 3 x daily - 1 sets - 4 reps - 45 seconds hold - 2 minutes rest Isometric Heel Raise at Wall - 3 x daily - 4 sets - 1 reps - 45 seconds hold - 2 minutes rest Toe Spreading - 1 x daily - 1 sets - 20 reps -  4 seconds hold Seated Lesser Toes Extension - 1 x daily - 1 sets - 20 reps - 4 seconds hold Seated Great Toe Extension - 1 x daily - 1 sets - 20 reps - 4 seconds hold Single Leg Stance - 1 x daily - 3 sets - 10 reps   HOME EXERCISE PROGRAM [[id number]]   ARCH LIFT - PENNY AND PEN -  Repeat 20 Times, Hold 5 Seconds, Complete 1 Set, Perform 2 Times a Day    PT Education - 12/05/21 1132     Education Details Exercise purpose/form. Self management techniques.    Person(s) Educated Patient    Methods Explanation;Demonstration;Tactile cues;Verbal cues    Comprehension Verbalized understanding;Returned demonstration;Verbal cues required;Tactile cues required;Need further instruction              PT  Short Term Goals - 11/19/21 1000       PT SHORT TERM GOAL #1   Title Be independent with initial home exercise program for self-management of symptoms.    Baseline Initial HEP provided at IE (11/14/2021);    Time 3    Period Weeks    Status Achieved    Target Date 11/28/21               PT Long Term Goals - 11/14/21 1337       PT LONG TERM GOAL #1   Title Be independent with a long-term home exercise program for self-management of symptoms.    Baseline Initial HEP provided at IE (11/14/2021);    Time 12    Period Weeks    Status New   TARGET DATE FOR ALL LONG TERM GOALS: 02/06/2022     PT LONG TERM GOAL #2   Title Demonstrate improved FOTO to equal or greater than 67 by visit #14 to demonstrate improvement in overall condition and self-reported functional ability.    Baseline 47 (11/14/2021);    Time 12    Period Weeks    Status New      PT LONG TERM GOAL #3   Title Patient will demonstrate L ankle DF and PF  ROM equal or greater than R ankle ROM with no increase in pain to improve gait pattern and improve ability to ambulate at work.    Baseline L LE significantly lacking - see objective exam (11/14/2021);    Time 12    Period Weeks    Status New      PT LONG TERM GOAL #4   Title Patient will demonstrate ability to perform 20 single leg heel raises with no increase pain on L LE for improved toe-off with gait over 12-hour work shift.    Baseline MMT 4/5 in seated position (11/14/2021);    Time 12    Period Weeks    Status New      PT LONG TERM GOAL #5   Title Complete community, work and/or recreational activities without limitation due to current condition.    Baseline Functional Limitations: difficulty with weight bearing activities such as walking, standing, pushing, pulling, lifting, working as a Chartered certified accountant, Control and instrumentation engineer, social participation, hobbies, household and community mobility, going up hills, navigating stairs, etc (11/14/2021);    Time 12    Period Weeks     Status New                   Plan - 12/05/21 1559     Clinical Impression Statement Patient tolerated treatment with mild difficulty due to some developing discomfort at  the left mid achilles tendon and right foot by end of session. Up to this point her L achilles pain has been improving and she has been able to go back to work in her boot without flaring pain in the left achilles tendon. However, she continues to struggle with B plantar fasciitis symptoms and had limiting pain there after three shifts in a row at work. She is only wearing the boot at work or when needing to walk long distances now. Shifted interventions today slightly to work towards improving dorsiflexion bilaterally to decrease plantar fascia strain without irritating left achilles tendon. Realized patient had not been performing gastroc stretch as imagined and cued her for improved technique this session. Plan to assess her tolerance for increased ankle dorsiflexion mobility work when she returns next session .Patient would benefit from continued management of limiting condition by skilled physical therapist to address remaining impairments and functional limitations to work towards stated goals and return to PLOF or maximal functional independence.    Personal Factors and Comorbidities Comorbidity 3+;Past/Current Experience;Time since onset of injury/illness/exacerbation;Profession    Comorbidities Relevant past medical history and comorbidities include bilateral plantar fasciitis, low back pain without sciatica, obesity    Examination-Activity Limitations Lift;Stairs;Locomotion Level;Stand;Caring for Others;Carry;Sleep;Transfers    Examination-Participation Restrictions Community Activity;Occupation;Interpersonal Relationship   difficulty with weight bearing activities such as walking, standing, pushing, pulling, lifting, working as a Chartered certified accountant, Control and instrumentation engineer, social participation, hobbies, household and community mobility, going  up hills, navigating stairs, etc.   Stability/Clinical Decision Making Evolving/Moderate complexity    Rehab Potential Good    PT Frequency 2x / week    PT Duration 12 weeks    PT Treatment/Interventions ADLs/Self Care Home Management;Cryotherapy;Moist Heat;Electrical Stimulation;Iontophoresis '4mg'$ /ml Dexamethasone;Ultrasound;Gait training;Therapeutic activities;Therapeutic exercise;Balance training;Neuromuscular re-education;Manual techniques;Dry needling;Passive range of motion;Spinal Manipulations;Joint Manipulations;Patient/family education    PT Next Visit Plan update HEP as appropriate, manual therapy, consider dry needling, isometric loading progressing to dynamic and strengthening progression as tolerated, intrinsic foot strengthening    PT Home Exercise Plan Medbridge Access Code: [email or web address]    Consulted and Agree with Plan of Care Patient             Patient will benefit from skilled therapeutic intervention in order to improve the following deficits and impairments:  Abnormal gait, Increased fascial restricitons, Improper body mechanics, Pain, Decreased mobility, Increased muscle spasms, Postural dysfunction, Decreased activity tolerance, Decreased endurance, Decreased range of motion, Decreased strength, Hypomobility, Impaired perceived functional ability, Decreased balance, Difficulty walking, Obesity, Impaired flexibility  Visit Diagnosis: Pain in left ankle and joints of left foot  Other symptoms and signs involving the musculoskeletal system  Stiffness of left ankle, not elsewhere classified  Muscle weakness (generalized)     Problem List Patient Active Problem List   Diagnosis Date Noted   Post-operative state 03/18/2016   Moderate aortic valve insufficiency 02/15/2016    Nancy Nordmann, PT 12/05/2021, 4:00 PM  [facility] [address] [place], [address] Phone: [phone]   Fax:   [phone]  Name: Laura Schmitt MRN: [id number] Date of Birth: [birth year]

## 2021-12-06 ENCOUNTER — Ambulatory Visit: Payer: No Typology Code available for payment source | Admitting: Physical Therapy

## 2021-12-06 ENCOUNTER — Encounter: Payer: Self-pay | Admitting: Physical Therapy

## 2021-12-06 DIAGNOSIS — R29898 Other symptoms and signs involving the musculoskeletal system: Secondary | ICD-10-CM

## 2021-12-06 DIAGNOSIS — M25672 Stiffness of left ankle, not elsewhere classified: Secondary | ICD-10-CM

## 2021-12-06 DIAGNOSIS — M6281 Muscle weakness (generalized): Secondary | ICD-10-CM

## 2021-12-06 DIAGNOSIS — M25572 Pain in left ankle and joints of left foot: Secondary | ICD-10-CM

## 2021-12-06 NOTE — Therapy (Signed)
[facility] [address], [place], [address] Phone: [phone]   Fax:  [phone]  Physical Therapy Treatment  Patient Details  Name: Laura Schmitt MRN: [id number] Date of Birth: [birth year] Referring Provider (PT): Samara Deist, DPM   Encounter Date: 12/06/2021   PT End of Session - 12/06/21 1405     Visit Number 7    Number of Visits 24    Date for PT Re-Evaluation 02/06/22    Authorization Type [facility] [place] [employer] reporting period from 11/14/2021    Progress Note Due on Visit 10    PT Start Time 0953    PT Stop Time 1033    PT Time Calculation (min) 40 min    Activity Tolerance Patient tolerated treatment well;Patient limited by pain    Behavior During Therapy [facility] for tasks assessed/performed             Past Medical History:  Diagnosis Date   Anemia    Cervical dysplasia    Dysplastic nevus 05/11/2008   RLQA - mild to moderate   Dysplastic nevus 05/11/2008   RUQA - moderate, excision 06/02/2008   Dysplastic nevus 05/11/2008   L distal pretibial - excision 06/09/2008   Heart murmur    aortic valve    Hemorrhoids, external, thrombosed    Moderate aortic insufficiency     Past Surgical History:  Procedure Laterality Date   ABDOMINAL HYSTERECTOMY     BILATERAL SALPINGECTOMY Bilateral 03/18/2016   Procedure: BILATERAL SALPINGECTOMY;  Surgeon: Boykin Nearing, MD;  Location: [facility] ORS;  Service: Gynecology;  Laterality: Bilateral;   COLONOSCOPY WITH PROPOFOL N/A 10/23/2017   Procedure: COLONOSCOPY WITH PROPOFOL;  Surgeon: Lollie Sails, MD;  Location: [facility] ENDOSCOPY;  Service: Endoscopy;  Laterality: N/A;   COLONOSCOPY WITH PROPOFOL N/A 02/11/2020   Procedure: COLONOSCOPY WITH PROPOFOL;  Surgeon: Robert Bellow, MD;  Location: [facility] ENDOSCOPY;  Service: Endoscopy;  Laterality: N/A;   ESOPHAGOGASTRODUODENOSCOPY (EGD) WITH PROPOFOL N/A 02/11/2020   Procedure:  ESOPHAGOGASTRODUODENOSCOPY (EGD) WITH PROPOFOL;  Surgeon: Robert Bellow, MD;  Location: [facility] ENDOSCOPY;  Service: Endoscopy;  Laterality: N/A;   I&D THROMBOSED HEMORRHOID  01/05/2008   LEEP  08/14/2010   Lt ear cyst     SINUS EXPLORATION     VAGINAL HYSTERECTOMY N/A 03/18/2016   Procedure: HYSTERECTOMY VAGINAL;  Surgeon: Boykin Nearing, MD;  Location: [facility] ORS;  Service: Gynecology;  Laterality: N/A;    There were no vitals filed for this visit.   Subjective Assessment - 12/06/21 1000     Subjective Patient reports her doctor with Dr. Vickki Muff went well and he was very please with her progress and has released her from his care to continue PT as needed. Patient reports 1/10 pain/soreness in bilateral achilles tendons and they felt very "stretched" and she felt a bit sore like she had worked after last PT session. She has no pain in her plantar feet currently. She works again next tuesday.    Pertinent History Patient is a 51 y.o. female who presents to outpatient physical therapy with a referral for medical diagnosis achilles tendon pain. This patient's chief complaints consist of left sided pain over the achilles tendon that radiates towards the knee in the setting of chronic plantar fascia pain leading to the following functional deficits: difficulty with weight bearing activities such as walking, standing, pushing, pulling, lifting, working as a Chartered certified accountant, Control and instrumentation engineer, social participation, hobbies, household and community mobility, going up hills,  navigating stairs, etc.  Relevant past medical history and comorbidities include bilateral plantar fasciitis, low back pain without sciatica.  Patient denies hx of cancer, stroke, seizures, lung problem, major cardiac events, diabetes, unexplained weight loss, changes in bowel or bladder problems, new onset stumbling or dropping things, spinal surgery, ankle/foot surgery, sciatic pain.    Limitations Lifting;Standing;Walking;House hold  activities;Other (comment)   difficulty with weight bearing activities such as walking, standing, pushing, pulling, lifting, working as a Chartered certified accountant, Control and instrumentation engineer, social participation, hobbies, household and community mobility, going up hills, navigating stairs, etc.   Currently in Pain? Yes    Pain Score 1                TREATMENT:  Not sensitive to latex   Therapeutic exercise: to centralize symptoms and improve ROM, strength, muscular endurance, and activity tolerance required for successful completion of functional activities.  - NuStep level 5 using bilateral upper and lower extremities. Seat/handle setting 8/10. For improved extremity mobility, muscular endurance, and activity tolerance; and to induce the analgesic effect of aerobic exercise, stimulate improved joint nutrition, and prepare body structures and systems for following interventions. x 5 minutes. Average SPM = 61.    SHOES DOFFED - standing DLS isometric heel raise, 4x45 seconds (partial elevation of heel), encouragement to weight bear as much on L LE as possible without increasing pain or getting so tired she cannot maintain the position for the full 45 seconds. At least 2 min rests between sets. - SLS balance with 5# DB pass side to side, 3x10 passes each side.  - SLS L eccentric heel raise (to tolerated height), 1x10 with SLOW lower (>7 seconds).  - Education on HEP including handout     Manual therapy: to reduce pain and tissue tension, improve range of motion, neuromodulation, in order to promote improved ability to complete functional activities. LONG SITTING - talocrural joint posterior mobilization, 4x30 seconds each side, grade III-IV to improve dorsiflexion at the ankle to decrease sprain on plantar fascia while ambulating    Pt required multimodal cuing for proper technique and to facilitate improved neuromuscular control, strength, range of motion, and functional ability resulting in improved performance and  form.   HOME EXERCISE PROGRAM Access Code: [email or web address] URL: [email or web address] Date: 12/06/2021 Prepared by: Rosita Kea  Exercises Isometric Heel Raise at [place] - 3 x daily - 4 sets - 1 reps - 45 seconds hold - 2 minutes rest Toe Spreading - 1 x daily - 1 sets - 20 reps - 4 seconds hold Seated Lesser Toes Extension - 1 x daily - 1 sets - 20 reps - 4 seconds hold Seated Great Toe Extension - 1 x daily - 1 sets - 20 reps - 4 seconds hold Single Leg Stance - 1 x daily - 3 sets - 10 reps Standing Eccentric Heel Raise - 2-3 x daily - 1 sets - 10-15 reps - > 7 seconds lower time    HOME EXERCISE PROGRAM [[id number]]   ARCH LIFT - PENNY AND PEN -  Repeat 20 Times, Hold 5 Seconds, Complete 1 Set, Perform 2 Times a Day       PT Education - 12/06/21 1405     Education Details Exercise purpose/form. Self management techniques.    Person(s) Educated Patient    Methods Explanation;Demonstration;Tactile cues;Verbal cues    Comprehension Verbalized understanding;Returned demonstration;Verbal cues required;Tactile cues required;Need further instruction              PT Short Term Goals -  11/19/21 1000       PT SHORT TERM GOAL #1   Title Be independent with initial home exercise program for self-management of symptoms.    Baseline Initial HEP provided at IE (11/14/2021);    Time 3    Period Weeks    Status Achieved    Target Date 11/28/21               PT Long Term Goals - 11/14/21 1337       PT LONG TERM GOAL #1   Title Be independent with a long-term home exercise program for self-management of symptoms.    Baseline Initial HEP provided at IE (11/14/2021);    Time 12    Period Weeks    Status New   TARGET DATE FOR ALL LONG TERM GOALS: 02/06/2022     PT LONG TERM GOAL #2   Title Demonstrate improved FOTO to equal or greater than 67 by visit #14 to demonstrate improvement in overall condition and self-reported functional ability.    Baseline 47 (11/14/2021);     Time 12    Period Weeks    Status New      PT LONG TERM GOAL #3   Title Patient will demonstrate L ankle DF and PF  ROM equal or greater than R ankle ROM with no increase in pain to improve gait pattern and improve ability to ambulate at work.    Baseline L LE significantly lacking - see objective exam (11/14/2021);    Time 12    Period Weeks    Status New      PT LONG TERM GOAL #4   Title Patient will demonstrate ability to perform 20 single leg heel raises with no increase pain on L LE for improved toe-off with gait over 12-hour work shift.    Baseline MMT 4/5 in seated position (11/14/2021);    Time 12    Period Weeks    Status New      PT LONG TERM GOAL #5   Title Complete community, work and/or recreational activities without limitation due to current condition.    Baseline Functional Limitations: difficulty with weight bearing activities such as walking, standing, pushing, pulling, lifting, working as a Chartered certified accountant, Control and instrumentation engineer, social participation, hobbies, household and community mobility, going up hills, navigating stairs, etc (11/14/2021);    Time 12    Period Weeks    Status New                   Plan - 12/06/21 1413     Clinical Impression Statement Patient tolerated treatment well overall and was able to progress to slow eccentric loading of L achilles tendon. Patient with minimal to no pain from stretches to the L triceps surae yesterday but does feel more sore than expected on the right triceps surae with almost equal soreness there from stretching both sides yesterday. Plan to monitor this and gently add stretches to HEP as tolerated. Plantar fascia pain better today. Patient would likely benefit from dropping to 1x a week frequency with updated HEP to be completed next session. She would benefit from total ankle strengthening and continues to complain of quick fatigue in the ankles and feet. Recommended patient spend some time at home moving around without shoes on  to be gradually progressed per tolerance to encourage improved strength, endurance, and ROM of foot/ankle without irritating it. Patient would benefit from continued management of limiting condition by skilled physical therapist to address remaining impairments and  functional limitations to work towards stated goals and return to PLOF or maximal functional independence.    Personal Factors and Comorbidities Comorbidity 3+;Past/Current Experience;Time since onset of injury/illness/exacerbation;Profession    Comorbidities Relevant past medical history and comorbidities include bilateral plantar fasciitis, low back pain without sciatica, obesity    Examination-Activity Limitations Lift;Stairs;Locomotion Level;Stand;Caring for Others;Carry;Sleep;Transfers    Examination-Participation Restrictions Community Activity;Occupation;Interpersonal Relationship   difficulty with weight bearing activities such as walking, standing, pushing, pulling, lifting, working as a Chartered certified accountant, Control and instrumentation engineer, social participation, hobbies, household and community mobility, going up hills, navigating stairs, etc.   Stability/Clinical Decision Making Evolving/Moderate complexity    Rehab Potential Good    PT Frequency 2x / week    PT Duration 12 weeks    PT Treatment/Interventions ADLs/Self Care Home Management;Cryotherapy;Moist Heat;Electrical Stimulation;Iontophoresis '4mg'$ /ml Dexamethasone;Ultrasound;Gait training;Therapeutic activities;Therapeutic exercise;Balance training;Neuromuscular re-education;Manual techniques;Dry needling;Passive range of motion;Spinal Manipulations;Joint Manipulations;Patient/family education    PT Next Visit Plan update HEP as appropriate, manual therapy, consider dry needling, isometric loading progressing to dynamic and strengthening progression as tolerated, intrinsic foot strengthening    PT Home Exercise Plan Medbridge Access Code: [email or web address]    Consulted and Agree with Plan of Care Patient              Patient will benefit from skilled therapeutic intervention in order to improve the following deficits and impairments:  Abnormal gait, Increased fascial restricitons, Improper body mechanics, Pain, Decreased mobility, Increased muscle spasms, Postural dysfunction, Decreased activity tolerance, Decreased endurance, Decreased range of motion, Decreased strength, Hypomobility, Impaired perceived functional ability, Decreased balance, Difficulty walking, Obesity, Impaired flexibility  Visit Diagnosis: Pain in left ankle and joints of left foot  Other symptoms and signs involving the musculoskeletal system  Stiffness of left ankle, not elsewhere classified  Muscle weakness (generalized)     Problem List Patient Active Problem List   Diagnosis Date Noted   Post-operative state 03/18/2016   Moderate aortic valve insufficiency 02/15/2016    Everlean Alstrom. Graylon Good, PT, DPT 12/06/21, 2:14 PM   [facility] [address], [place], [address] Phone: [phone]   Fax:  [phone]  Name: Laura Schmitt MRN: [id number] Date of Birth: 12/08/1970

## 2021-12-10 ENCOUNTER — Ambulatory Visit: Payer: No Typology Code available for payment source | Admitting: Physical Therapy

## 2021-12-10 ENCOUNTER — Encounter: Payer: Self-pay | Admitting: Physical Therapy

## 2021-12-10 ENCOUNTER — Other Ambulatory Visit: Payer: Self-pay

## 2021-12-10 DIAGNOSIS — M25572 Pain in left ankle and joints of left foot: Secondary | ICD-10-CM | POA: Diagnosis not present

## 2021-12-10 DIAGNOSIS — M25672 Stiffness of left ankle, not elsewhere classified: Secondary | ICD-10-CM

## 2021-12-10 DIAGNOSIS — R29898 Other symptoms and signs involving the musculoskeletal system: Secondary | ICD-10-CM

## 2021-12-10 DIAGNOSIS — M6281 Muscle weakness (generalized): Secondary | ICD-10-CM

## 2021-12-10 NOTE — Therapy (Signed)
[facility] [address] [place], [address] Phone: [phone]   Fax:  [phone]  Physical Therapy Treatment  Patient Details  Name: Laura Schmitt MRN: [id number] Date of Birth: [birth year] Referring Provider (PT): Samara Deist, DPM   Encounter Date: 12/10/2021   PT End of Session - 12/10/21 1339     Visit Number 8    Number of Visits 24    Date for PT Re-Evaluation 02/06/22    Authorization Type [place] FOCUS reporting period from 11/14/2021    Progress Note Due on Visit 10    PT Start Time 0905    PT Stop Time 0945    PT Time Calculation (min) 40 min    Activity Tolerance Patient tolerated treatment well    Behavior During Therapy [facility] for tasks assessed/performed             Past Medical History:  Diagnosis Date   Anemia    Cervical dysplasia    Dysplastic nevus 05/11/2008   RLQA - mild to moderate   Dysplastic nevus 05/11/2008   RUQA - moderate, excision 06/02/2008   Dysplastic nevus 05/11/2008   L distal pretibial - excision 06/09/2008   Heart murmur    aortic valve    Hemorrhoids, external, thrombosed    Moderate aortic insufficiency     Past Surgical History:  Procedure Laterality Date   ABDOMINAL HYSTERECTOMY     BILATERAL SALPINGECTOMY Bilateral 03/18/2016   Procedure: BILATERAL SALPINGECTOMY;  Surgeon: Boykin Nearing, MD;  Location: [facility] ORS;  Service: Gynecology;  Laterality: Bilateral;   COLONOSCOPY WITH PROPOFOL N/A 10/23/2017   Procedure: COLONOSCOPY WITH PROPOFOL;  Surgeon: Lollie Sails, MD;  Location: [facility] ENDOSCOPY;  Service: Endoscopy;  Laterality: N/A;   COLONOSCOPY WITH PROPOFOL N/A 02/11/2020   Procedure: COLONOSCOPY WITH PROPOFOL;  Surgeon: Robert Bellow, MD;  Location: [facility] ENDOSCOPY;  Service: Endoscopy;  Laterality: N/A;   ESOPHAGOGASTRODUODENOSCOPY (EGD) WITH PROPOFOL N/A 02/11/2020   Procedure: ESOPHAGOGASTRODUODENOSCOPY (EGD) WITH PROPOFOL;   Surgeon: Robert Bellow, MD;  Location: [facility] ENDOSCOPY;  Service: Endoscopy;  Laterality: N/A;   I&D THROMBOSED HEMORRHOID  01/05/2008   LEEP  08/14/2010   Lt ear cyst     SINUS EXPLORATION     VAGINAL HYSTERECTOMY N/A 03/18/2016   Procedure: HYSTERECTOMY VAGINAL;  Surgeon: Boykin Nearing, MD;  Location: [facility] ORS;  Service: Gynecology;  Laterality: N/A;    There were no vitals filed for this visit.   Subjective Assessment - 12/10/21 1335     Subjective Patient reports she is feeling well and currently has no pain. She worked one shift this weekend and had pain in her right plantar fascia of up to 2/10. She wore the boot on the left ankle. She states she is doing her HEP and was able to progress to eccentric heel raises. She is agreeable to doing more exercises at home independently and shifting to 1x a week PT visit frequency. There was an unexpected tragety in her life this weekend so she is feeling a little off from that.    Pertinent History Patient is a 51 y.o. female who presents to outpatient physical therapy with a referral for medical diagnosis achilles tendon pain. This patient's chief complaints consist of left sided pain over the achilles tendon that radiates towards the knee in the setting of chronic plantar fascia pain leading to the following functional deficits: difficulty with weight bearing activities such as walking, standing, pushing,  pulling, lifting, working as a Chartered certified accountant, Control and instrumentation engineer, social participation, hobbies, household and community mobility, going up hills, navigating stairs, etc.  Relevant past medical history and comorbidities include bilateral plantar fasciitis, low back pain without sciatica.  Patient denies hx of cancer, stroke, seizures, lung problem, major cardiac events, diabetes, unexplained weight loss, changes in bowel or bladder problems, new onset stumbling or dropping things, spinal surgery, ankle/foot surgery, sciatic pain.    Limitations  Lifting;Standing;Walking;House hold activities;Other (comment)   difficulty with weight bearing activities such as walking, standing, pushing, pulling, lifting, working as a Chartered certified accountant, Control and instrumentation engineer, social participation, hobbies, household and community mobility, going up hills, navigating stairs, etc.   Currently in Pain? No/denies                TREATMENT:  Not sensitive to latex   Therapeutic exercise: to centralize symptoms and improve ROM, strength, muscular endurance, and activity tolerance required for successful completion of functional activities.    SHOES DOFFED - seated ankle eversion, 2x20 with Red/Green Theraband each foot - seated figure 4, 2x20 with Red/Green Theraband each foot - seated toe splay with half yellow TB loops around great and baby toes, 1x20 with 4 second holds on both sides.  - SLS L eccentric heel raise (to tolerated height), 1x10 with SLOW lower (>7 seconds).  - review of gastroc and soleus stretches  - Education on HEP including handout    Pt required multimodal cuing for proper technique and to facilitate improved neuromuscular control, strength, range of motion, and functional ability resulting in improved performance and form.   HOME EXERCISE PROGRAM Access Code: [email or web address] URL: [email or web address] Date: 12/10/2021 Prepared by: Rosita Kea  Exercises Standing Eccentric Heel Raise - 2-3 x daily - 1 sets - 10-15 reps - > 7 seconds lower time Toe Spreading - 1 x daily - 1 sets - 20 reps - 4 seconds hold Seated Lesser Toes Extension - 1 x daily - 1 sets - 20 reps - 4 seconds hold Seated Great Toe Extension - 1 x daily - 1 sets - 20 reps - 4 seconds hold Single Leg Stance - 1 x daily - 3 sets - 10 reps Seated Ankle Eversion with Resistance - 3-5 x daily - 3 sets - 20 reps Seated Figure 4 Ankle Inversion with Resistance - 3-5 x daily - 3 sets - 20 reps Gastroc Stretch on Wall - 3-5 x weekly - 3 reps - 30 seconds hold Soleus Stretch on  Wall - 3-5 x weekly - 3 reps - 30 seconds hold   HOME EXERCISE PROGRAM [[id number]]   ARCH LIFT - PENNY AND PEN -  Repeat 20 Times, Hold 5 Seconds, Complete 1 Set, Perform 2 Times a Day      PT Education - 12/10/21 1338     Education Details Exercise purpose/form. Self management techniques.    Person(s) Educated Patient    Methods Explanation;Demonstration;Tactile cues;Verbal cues    Comprehension Verbalized understanding;Returned demonstration;Verbal cues required;Tactile cues required;Need further instruction              PT Short Term Goals - 11/19/21 1000       PT SHORT TERM GOAL #1   Title Be independent with initial home exercise program for self-management of symptoms.    Baseline Initial HEP provided at IE (11/14/2021);    Time 3    Period Weeks    Status Achieved    Target Date 11/28/21  PT Long Term Goals - 11/14/21 1337       PT LONG TERM GOAL #1   Title Be independent with a long-term home exercise program for self-management of symptoms.    Baseline Initial HEP provided at IE (11/14/2021);    Time 12    Period Weeks    Status New   TARGET DATE FOR ALL LONG TERM GOALS: 02/06/2022     PT LONG TERM GOAL #2   Title Demonstrate improved FOTO to equal or greater than 67 by visit #14 to demonstrate improvement in overall condition and self-reported functional ability.    Baseline 47 (11/14/2021);    Time 12    Period Weeks    Status New      PT LONG TERM GOAL #3   Title Patient will demonstrate L ankle DF and PF  ROM equal or greater than R ankle ROM with no increase in pain to improve gait pattern and improve ability to ambulate at work.    Baseline L LE significantly lacking - see objective exam (11/14/2021);    Time 12    Period Weeks    Status New      PT LONG TERM GOAL #4   Title Patient will demonstrate ability to perform 20 single leg heel raises with no increase pain on L LE for improved toe-off with gait over 12-hour work shift.     Baseline MMT 4/5 in seated position (11/14/2021);    Time 12    Period Weeks    Status New      PT LONG TERM GOAL #5   Title Complete community, work and/or recreational activities without limitation due to current condition.    Baseline Functional Limitations: difficulty with weight bearing activities such as walking, standing, pushing, pulling, lifting, working as a Chartered certified accountant, Control and instrumentation engineer, social participation, hobbies, household and community mobility, going up hills, navigating stairs, etc (11/14/2021);    Time 12    Period Weeks    Status New                   Plan - 12/10/21 1345     Clinical Impression Statement Patient tolerated treatment well overall and was able to tolerate banded ankle exercises and bands for the toe splay exercise. Patient is planning to decrease visit frequency to 1x a week and increase her independence with HEP. Plan to continue working on increased foot and ankle strength and mobility next session. Patient would benefit from continued management of limiting condition by skilled physical therapist to address remaining impairments and functional limitations to work towards stated goals and return to PLOF or maximal functional independence.    Personal Factors and Comorbidities Comorbidity 3+;Past/Current Experience;Time since onset of injury/illness/exacerbation;Profession    Comorbidities Relevant past medical history and comorbidities include bilateral plantar fasciitis, low back pain without sciatica, obesity    Examination-Activity Limitations Lift;Stairs;Locomotion Level;Stand;Caring for Others;Carry;Sleep;Transfers    Examination-Participation Restrictions Community Activity;Occupation;Interpersonal Relationship   difficulty with weight bearing activities such as walking, standing, pushing, pulling, lifting, working as a Chartered certified accountant, Control and instrumentation engineer, social participation, hobbies, household and community mobility, going up hills, navigating stairs, etc.    Stability/Clinical Decision Making Evolving/Moderate complexity    Rehab Potential Good    PT Frequency 2x / week    PT Duration 12 weeks    PT Treatment/Interventions ADLs/Self Care Home Management;Cryotherapy;Moist Heat;Electrical Stimulation;Iontophoresis '4mg'$ /ml Dexamethasone;Ultrasound;Gait training;Therapeutic activities;Therapeutic exercise;Balance training;Neuromuscular re-education;Manual techniques;Dry needling;Passive range of motion;Spinal Manipulations;Joint Manipulations;Patient/family education    PT Next Visit Plan  update HEP as appropriate, manual therapy, consider dry needling, isometric loading progressing to dynamic and strengthening progression as tolerated, intrinsic foot strengthening    PT Home Exercise Plan Medbridge Access Code: [email or web address]    Consulted and Agree with Plan of Care Patient             Patient will benefit from skilled therapeutic intervention in order to improve the following deficits and impairments:  Abnormal gait, Increased fascial restricitons, Improper body mechanics, Pain, Decreased mobility, Increased muscle spasms, Postural dysfunction, Decreased activity tolerance, Decreased endurance, Decreased range of motion, Decreased strength, Hypomobility, Impaired perceived functional ability, Decreased balance, Difficulty walking, Obesity, Impaired flexibility  Visit Diagnosis: Pain in left ankle and joints of left foot  Other symptoms and signs involving the musculoskeletal system  Stiffness of left ankle, not elsewhere classified  Muscle weakness (generalized)     Problem List Patient Active Problem List   Diagnosis Date Noted   Post-operative state 03/18/2016   Moderate aortic valve insufficiency 02/15/2016    Everlean Alstrom. Graylon Good, PT, DPT 12/10/21, 1:46 PM   [facility] [address], [place], [address] Phone: [phone]   Fax:  [phone]  Name: Laura Schmitt MRN: [id number] Date of Birth: 02-26-1971

## 2021-12-19 ENCOUNTER — Ambulatory Visit: Payer: No Typology Code available for payment source | Admitting: Physical Therapy

## 2021-12-26 ENCOUNTER — Ambulatory Visit: Payer: No Typology Code available for payment source | Admitting: Physical Therapy

## 2021-12-26 ENCOUNTER — Telehealth: Payer: Self-pay | Admitting: Physical Therapy

## 2021-12-26 NOTE — Telephone Encounter (Addendum)
Called patient when she did not show up for her appointment scheduled at 2:30pm today. Left VM requesting call back.  ? ?Everlean Alstrom. Graylon Good, PT, DPT ?12/26/21, 3:18 PM ? ?Patient called back and said she never got a call to let her know that the appointment yesterday had been made. She scheduled for 4/4 at 10:30am and 4/11 at 11:15pm.  ? ?Everlean Alstrom. Graylon Good, PT, DPT ?12/27/21, 1:16 PM ? ?[facility] ?[address] ?[place], [place] [address] ?P: [phone] I F: [phone] ? ? ?

## 2022-01-01 ENCOUNTER — Encounter: Payer: Self-pay | Admitting: Physical Therapy

## 2022-01-01 ENCOUNTER — Ambulatory Visit: Payer: No Typology Code available for payment source | Attending: Podiatry | Admitting: Physical Therapy

## 2022-01-01 DIAGNOSIS — M25572 Pain in left ankle and joints of left foot: Secondary | ICD-10-CM | POA: Diagnosis present

## 2022-01-01 DIAGNOSIS — R29898 Other symptoms and signs involving the musculoskeletal system: Secondary | ICD-10-CM | POA: Diagnosis present

## 2022-01-01 DIAGNOSIS — M6281 Muscle weakness (generalized): Secondary | ICD-10-CM | POA: Insufficient documentation

## 2022-01-01 DIAGNOSIS — M25672 Stiffness of left ankle, not elsewhere classified: Secondary | ICD-10-CM | POA: Diagnosis present

## 2022-01-01 NOTE — Therapy (Signed)
?OUTPATIENT PHYSICAL THERAPY TREATMENT NOTE ? ? ?Patient Name: Laura Schmitt ?MRN: [id number] ?DOB:10/04/1970, 51 y.o., female ?Today's Date: 01/01/2022 ? ?PCP: Tracie Harrier, MD ?REFERRING PROVIDER: Samara Deist, DPM ? ? PT End of Session - 01/01/22 1128   ? ? Visit Number 9   ? Number of Visits 24   ? Date for PT Re-Evaluation 02/06/22   ? Authorization Type [place] FOCUS reporting period from 11/14/2021   ? Progress Note Due on Visit 10   ? PT Start Time 1035   ? PT Stop Time 1115   ? PT Time Calculation (min) 40 min   ? Activity Tolerance Patient tolerated treatment well   ? Behavior During Therapy [facility] for tasks assessed/performed   ? ?  ?  ? ?  ? ? ?Past Medical History:  ?Diagnosis Date  ? Anemia   ? Cervical dysplasia   ? Dysplastic nevus 05/11/2008  ? RLQA - mild to moderate  ? Dysplastic nevus 05/11/2008  ? RUQA - moderate, excision 06/02/2008  ? Dysplastic nevus 05/11/2008  ? L distal pretibial - excision 06/09/2008  ? Heart murmur   ? aortic valve   ? Hemorrhoids, external, thrombosed   ? Moderate aortic insufficiency   ? ?Past Surgical History:  ?Procedure Laterality Date  ? ABDOMINAL HYSTERECTOMY    ? BILATERAL SALPINGECTOMY Bilateral 03/18/2016  ? Procedure: BILATERAL SALPINGECTOMY;  Surgeon: Boykin Nearing, MD;  Location: [facility] ORS;  Service: Gynecology;  Laterality: Bilateral;  ? COLONOSCOPY WITH PROPOFOL N/A 10/23/2017  ? Procedure: COLONOSCOPY WITH PROPOFOL;  Surgeon: Lollie Sails, MD;  Location: [facility] ENDOSCOPY;  Service: Endoscopy;  Laterality: N/A;  ? COLONOSCOPY WITH PROPOFOL N/A 02/11/2020  ? Procedure: COLONOSCOPY WITH PROPOFOL;  Surgeon: Robert Bellow, MD;  Location: [facility] ENDOSCOPY;  Service: Endoscopy;  Laterality: N/A;  ? ESOPHAGOGASTRODUODENOSCOPY (EGD) WITH PROPOFOL N/A 02/11/2020  ? Procedure: ESOPHAGOGASTRODUODENOSCOPY (EGD) WITH PROPOFOL;  Surgeon: Robert Bellow, MD;  Location: [facility] ENDOSCOPY;  Service: Endoscopy;  Laterality: N/A;  ? I&D THROMBOSED HEMORRHOID   01/05/2008  ? LEEP  08/14/2010  ? Lt ear cyst    ? SINUS EXPLORATION    ? VAGINAL HYSTERECTOMY N/A 03/18/2016  ? Procedure: HYSTERECTOMY VAGINAL;  Surgeon: Boykin Nearing, MD;  Location: [facility] ORS;  Service: Gynecology;  Laterality: N/A;  ? ?Patient Active Problem List  ? Diagnosis Date Noted  ? Post-operative state 03/18/2016  ? Moderate aortic valve insufficiency 02/15/2016  ? ? ?REFERRING DIAG: achilles tendon pain ? ?THERAPY DIAG:  ?Pain in left ankle and joints of left foot ? ?Other symptoms and signs involving the musculoskeletal system ? ?Stiffness of left ankle, not elsewhere classified ? ?Muscle weakness (generalized) ? ?PERTINENT HISTORY: Patient is a 51 y.o. female who presents to outpatient physical therapy with a referral for medical diagnosis achilles tendon pain. This patient's chief complaints consist of left sided pain over the achilles tendon that radiates towards the knee in the setting of chronic plantar fascia pain leading to the following functional deficits: difficulty with weight bearing activities such as walking, standing, pushing, pulling, lifting, working as a Chartered certified accountant, Control and instrumentation engineer, social participation, hobbies, household and community mobility, going up hills, navigating stairs, etc. Relevant past medical history and comorbidities include bilateral plantar fasciitis, low back pain without sciatica. Patient denies hx of cancer, stroke, seizures, lung problem, major cardiac events, diabetes, unexplained weight loss, changes in bowel or bladder problems, new onset stumbling or dropping things, spinal surgery, ankle/foot surgery, sciatic pain. ? ?PRECAUTIONS: none ? ?  SUBJECTIVE: Patient reports her feet are feeling better but not perfect. Achilles tendon is feeling better and she is not having any issues with that. She is still wearing her boot while working. HEP is going alright. There were several days week before last where she didn't get them done because she had so much going on. She  last worked Sat and Sun night and did not have pain in her left foot with her boot and she did have some discomfort on her right foot but it was not bad. She has been wanting to take her boot off. She wants to take her boot off at work but is afraid to do it since she had so much pain before. She has only been wearing the boot while working. She would like to walk for exercise and not have her feet "kill" her. She feels most limited by plantar fasciitis.  ? ?PAIN:  ?Are you having pain? No ? ? ?TODAY'S TREATMENT:  ?Therapeutic exercise: to centralize symptoms and improve ROM, strength, muscular endurance, and activity tolerance required for successful completion of functional activities.  ?  ?SHOES DOFFED ?- standing foot doming with MTP joint lifting, ~ 10-15 each side to learn then, 1x25 each side. (Fatiguing).  ?- SLS balance with 5# DB pass side to side, 1x20 passes each side on firm surface,  ?- SLS balance on airex pad, 2x30 seconds each side (challenging).  ?- running man, 1x10 each side (challenging on L > R due to fatigue). .  ?- B gastroc and soleus stretch with toes elevated on towel roll, 1x30 seconds each side in each position.  ?- Education on HEP including handout  ?  ?Pt required multimodal cuing for proper technique and to facilitate improved neuromuscular control, strength, range of motion, and functional ability resulting in improved performance and form. ? ? ?PATIENT EDUCATION: ?Education details: Exercise purpose/form. Self management techniques. HEP ?Person educated: Patient ?Education method: Explanation, Demonstration, Tactile cues, and Verbal cues ?Education comprehension: verbalized understanding, returned demonstration, verbal cues required, tactile cues required, and needs further education ? ? ?HOME EXERCISE PROGRAM: ?Access Code: [email or web address] ?URL: [email or web address] ?Date: 01/01/2022 ?Prepared by: Rosita Kea ? ?Exercises ?- Standing Eccentric Heel Raise  - 2-3 x daily - 1  sets - 10-15 reps - > 7 seconds lower time ?- Toe Spreading  - 1 x daily - 1 sets - 20 reps - 4 seconds hold ?- Seated Lesser Toes Extension  - 1 x daily - 1 sets - 20 reps - 4 seconds hold ?- Seated Great Toe Extension  - 1 x daily - 1 sets - 20 reps - 4 seconds hold ?- Single Leg Stance on Foam Pad  - 1 x daily - 3 sets - 30 seconds hold ?- Seated Ankle Eversion with Resistance  - 3-5 x daily - 3 sets - 20 reps ?- Seated Figure 4 Ankle Inversion with Resistance  - 3-5 x daily - 3 sets - 20 reps ?- Gastroc Stretch on Wall  - 3-5 x weekly - 3 reps - 30 seconds hold ?- Soleus Stretch on Wall  - 3-5 x weekly - 3 reps - 30 seconds hold ?- Seated Arch Lifts  - 3 x daily - 1 sets - 25 reps ?  ? ? PT Short Term Goals   ? ?  ? PT SHORT TERM GOAL #1  ? Title Be independent with initial home exercise program for self-management of symptoms.   ? Baseline Initial HEP provided at IE (11/14/2021);   ?  Time 3   ? Period Weeks   ? Status Achieved   ? Target Date 11/28/21   ? ?  ?  ? ?  ? ? ? PT Long Term Goals   ?TARGET DATE FOR ALL LONG TERM GOALS: 02/06/2022 ?  ? PT LONG TERM GOAL #1  ? Title Be independent with a long-term home exercise program for self-management of symptoms.   ? Baseline Initial HEP provided at IE (11/14/2021);   ? Time 12   ? Period Weeks   ? Status In progress  ?  ? PT LONG TERM GOAL #2  ? Title Demonstrate improved FOTO to equal or greater than 67 by visit #14 to demonstrate improvement in overall condition and self-reported functional ability.   ? Baseline 47 (11/14/2021); 58 (11/29/2021);   ? Time 12   ? Period Weeks   ? Status In progress  ?  ? PT LONG TERM GOAL #3  ? Title Patient will demonstrate L ankle DF and PF  ROM equal or greater than R ankle ROM with no increase in pain to improve gait pattern and improve ability to ambulate at work.   ? Baseline L LE significantly lacking - see objective exam (11/14/2021);   ? Time 12   ? Period Weeks   ? Status In progress  ?  ? PT LONG TERM GOAL #4  ? Title Patient  will demonstrate ability to perform 20 single leg heel raises with no increase pain on L LE for improved toe-off with gait over 12-hour work shift.   ? Baseline MMT 4/5 in seated position (2/15/202

## 2022-01-08 ENCOUNTER — Ambulatory Visit: Payer: No Typology Code available for payment source | Admitting: Physical Therapy

## 2022-01-08 ENCOUNTER — Encounter: Payer: Self-pay | Admitting: Physical Therapy

## 2022-01-08 DIAGNOSIS — M25572 Pain in left ankle and joints of left foot: Secondary | ICD-10-CM

## 2022-01-08 DIAGNOSIS — R29898 Other symptoms and signs involving the musculoskeletal system: Secondary | ICD-10-CM

## 2022-01-08 DIAGNOSIS — M6281 Muscle weakness (generalized): Secondary | ICD-10-CM

## 2022-01-08 DIAGNOSIS — M25672 Stiffness of left ankle, not elsewhere classified: Secondary | ICD-10-CM

## 2022-01-08 NOTE — Therapy (Signed)
?OUTPATIENT PHYSICAL THERAPY TREATMENT / PROGRESS NOTE / RE-CERTIFICATION ?Dates of reporting from 11/14/2021 to 01/08/2022 ? ? ?Patient Name: Laura Schmitt ?MRN: [id number] ?DOB:[birth year], 51 y.o., female ?Today's Date: 01/08/2022 ? ?PCP: Tracie Harrier, MD ?REFERRING PROVIDER: Samara Deist, DPM ? ? PT End of Session - 01/08/22 1133   ? ? Visit Number 10   ? Number of Visits 24   ? Date for PT Re-Evaluation 04/02/22   ? Authorization Type [place] FOCUS reporting period from 11/14/2021   ? Progress Note Due on Visit 10   ? PT Start Time 1123   ? PT Stop Time 1205   ? PT Time Calculation (min) 42 min   ? Activity Tolerance Patient tolerated treatment well   ? Behavior During Therapy [facility] for tasks assessed/performed   ? ?  ?  ? ?  ? ? ? ?Past Medical History:  ?Diagnosis Date  ? Anemia   ? Cervical dysplasia   ? Dysplastic nevus 05/11/2008  ? RLQA - mild to moderate  ? Dysplastic nevus 05/11/2008  ? RUQA - moderate, excision 06/02/2008  ? Dysplastic nevus 05/11/2008  ? L distal pretibial - excision 06/09/2008  ? Heart murmur   ? aortic valve   ? Hemorrhoids, external, thrombosed   ? Moderate aortic insufficiency   ? ?Past Surgical History:  ?Procedure Laterality Date  ? ABDOMINAL HYSTERECTOMY    ? BILATERAL SALPINGECTOMY Bilateral 03/18/2016  ? Procedure: BILATERAL SALPINGECTOMY;  Surgeon: Boykin Nearing, MD;  Location: [facility] ORS;  Service: Gynecology;  Laterality: Bilateral;  ? COLONOSCOPY WITH PROPOFOL N/A 10/23/2017  ? Procedure: COLONOSCOPY WITH PROPOFOL;  Surgeon: Lollie Sails, MD;  Location: [facility] ENDOSCOPY;  Service: Endoscopy;  Laterality: N/A;  ? COLONOSCOPY WITH PROPOFOL N/A 02/11/2020  ? Procedure: COLONOSCOPY WITH PROPOFOL;  Surgeon: Robert Bellow, MD;  Location: [facility] ENDOSCOPY;  Service: Endoscopy;  Laterality: N/A;  ? ESOPHAGOGASTRODUODENOSCOPY (EGD) WITH PROPOFOL N/A 02/11/2020  ? Procedure: ESOPHAGOGASTRODUODENOSCOPY (EGD) WITH PROPOFOL;  Surgeon: Robert Bellow, MD;  Location:  [facility] ENDOSCOPY;  Service: Endoscopy;  Laterality: N/A;  ? I&D THROMBOSED HEMORRHOID  01/05/2008  ? LEEP  08/14/2010  ? Lt ear cyst    ? SINUS EXPLORATION    ? VAGINAL HYSTERECTOMY N/A 03/18/2016  ? Procedure: HYSTERECTOMY VAGINAL;  Surgeon: Boykin Nearing, MD;  Location: [facility] ORS;  Service: Gynecology;  Laterality: N/A;  ? ?Patient Active Problem List  ? Diagnosis Date Noted  ? Post-operative state 03/18/2016  ? Moderate aortic valve insufficiency 02/15/2016  ? ? ?REFERRING DIAG: achilles tendon pain ? ?THERAPY DIAG:  ?Pain in left ankle and joints of left foot ? ?Other symptoms and signs involving the musculoskeletal system ? ?Stiffness of left ankle, not elsewhere classified ? ?Muscle weakness (generalized) ? ?PERTINENT HISTORY: Patient is a 51 y.o. female who presents to outpatient physical therapy with a referral for medical diagnosis achilles tendon pain. This patient's chief complaints consist of left sided pain over the achilles tendon that radiates towards the knee in the setting of chronic plantar fascia pain leading to the following functional deficits: difficulty with weight bearing activities such as walking, standing, pushing, pulling, lifting, working as a Chartered certified accountant, Control and instrumentation engineer, social participation, hobbies, household and community mobility, going up hills, navigating stairs, etc. Relevant past medical history and comorbidities include bilateral plantar fasciitis, low back pain without sciatica. Patient denies hx of cancer, stroke, seizures, lung problem, major cardiac events, diabetes, unexplained weight loss, changes in bowel or bladder problems, new onset stumbling or  dropping things, spinal surgery, ankle/foot surgery, sciatic pain. ? ?PRECAUTIONS: none ? ?SUBJECTIVE: Patient reports she has no pain currently and her achilles tendon is not bothering her at all. She states her plantar fasciitis is mainly bothering her at work because she is constantly on her feet and doesn't get enough breaks.  She continues to wear her boot on the left foot while working. HEP is going well. She also does not wear shoes at home sometimes but puts them back on before her feet start to get symptomatic.  ? ?PAIN:  ?Are you having pain? No ? ?OBJECTIVE ? ?SELF-REPORTED FUNCTION ?FOTO score: 57/100 (Lower Leg - w/o Knee- questionnaire) ? ?PERIPHERAL JOINT MOTION (in degrees) ?  ?Active Range of Motion (AROM) ?*Indicates pain 11/14/21 01/08/22 Date  ?Joint/Motion R/L R/L R/L  ?Ankle/Foot        ?Dorsiflexion (knee ext) 12/5* /8* /  ?Dorsiflexion (knee flex) 12/0 /10* /  ?Plantarflexion 58/50 /62 /  ?[place] 12/18 /24* /  ?Inversion 35/25 /30* /  ? 01/08/2022: pulling discomfort in left calf with AROM, ?  ?Passive Range of Motion (PROM) ?*Indicates pain 11/14/21 01/08/22 Date  ?Joint/Motion R/L R/L R/L  ?Ankle/Foot        ?Dorsiflexion (knee ext) 30*/20* 35/28 /  ?Dorsiflexion (knee flex) 30*/25* 33/29 /  ?Great toe extension 84/84* /85* /  ? 01/08/2022 strong stretch in left calf muscle with DF ? ?MUSCLE PERFORMANCE (MMT):  ?SL Heel raise for reps with B UE support on wall ?R = 15 reps,  ?L = 12 reps, foot fatigue and pulling in calf, no tendon pain.  ? ?TODAY'S TREATMENT:  ?Therapeutic exercise: to centralize symptoms and improve ROM, strength, muscular endurance, and activity tolerance required for successful completion of functional activities ?  ?SHOES DOFFED ?- standing foot doming with MTP joint lifting, 1x25 each side. (fatiguing, good carry over, states this is her least favorite because it feels like it may cramp in her arch).  ?- measurements to assess progress (see above) ?- running man, 3x10 each side (challenging on L > R due to fatigue and pulling in left tendon).  ?- SLS balance on airex pad, 2x30 seconds each side (challenging).  ? ?Pt required multimodal cuing for proper technique and to facilitate improved neuromuscular control, strength, range of motion, and functional ability resulting in improved performance and  form. ? ? ?PATIENT EDUCATION: ?Education details: Exercise purpose/form. Self management techniques. HEP, POC, progress.  ?Person educated: Patient ?Education method: Explanation, Demonstration, Tactile cues, and Verbal cues ?Education comprehension: verbalized understanding, returned demonstration, verbal cues required, tactile cues required, and needs further education ? ? ?HOME EXERCISE PROGRAM: ?Access Code: [email or web address] ?URL: [email or web address] ?Date: 01/01/2022 ?Prepared by: Rosita Kea ? ?Exercises ?- Standing Eccentric Heel Raise  - 2-3 x daily - 1 sets - 10-15 reps - > 7 seconds lower time ?- Toe Spreading  - 1 x daily - 1 sets - 20 reps - 4 seconds hold ?- Seated Lesser Toes Extension  - 1 x daily - 1 sets - 20 reps - 4 seconds hold ?- Seated Great Toe Extension  - 1 x daily - 1 sets - 20 reps - 4 seconds hold ?- Single Leg Stance on Foam Pad  - 1 x daily - 3 sets - 30 seconds hold ?- Seated Ankle Eversion with Resistance  - 3-5 x daily - 3 sets - 20 reps ?- Seated Figure 4 Ankle Inversion with Resistance  - 3-5 x daily - 3 sets - 20 reps ?-  Gastroc Stretch on Wall  - 3-5 x weekly - 3 reps - 30 seconds hold ?- Soleus Stretch on Wall  - 3-5 x weekly - 3 reps - 30 seconds hold ?- Seated Arch Lifts  - 3 x daily - 1 sets - 25 reps ?  ? ? PT Short Term Goals   ? ?  ? PT SHORT TERM GOAL #1  ? Title Be independent with initial home exercise program for self-management of symptoms.   ? Baseline Initial HEP provided at IE (11/14/2021);   ? Time 3   ? Period Weeks   ? Status Achieved   ? Target Date 11/28/21   ? ?  ?  ? ?  ? ? ? PT Long Term Goals   ?TARGET DATE FOR ALL LONG TERM GOALS: 02/06/2022; UPDATED TARGET DATE TO 04/02/2022 FOR ALL UNMET GOALS (01/08/2022);  ?  ? PT LONG TERM GOAL #1  ? Title Be independent with a long-term home exercise program for self-management of symptoms.   ? Baseline Initial HEP provided at IE (11/14/2021); currently participating well (01/08/2022);   ? Time 12   ? Period Weeks    ? Status In progress  ?  ? PT LONG TERM GOAL #2  ? Title Demonstrate improved FOTO to equal or greater than 67 by visit #14 to demonstrate improvement in overall condition and self-reported functional a

## 2022-01-16 ENCOUNTER — Ambulatory Visit: Payer: No Typology Code available for payment source | Admitting: Physical Therapy

## 2022-01-16 ENCOUNTER — Encounter: Payer: Self-pay | Admitting: Physical Therapy

## 2022-01-16 DIAGNOSIS — M25672 Stiffness of left ankle, not elsewhere classified: Secondary | ICD-10-CM

## 2022-01-16 DIAGNOSIS — M25572 Pain in left ankle and joints of left foot: Secondary | ICD-10-CM

## 2022-01-16 DIAGNOSIS — M6281 Muscle weakness (generalized): Secondary | ICD-10-CM

## 2022-01-16 DIAGNOSIS — R29898 Other symptoms and signs involving the musculoskeletal system: Secondary | ICD-10-CM

## 2022-01-16 NOTE — Therapy (Addendum)
?OUTPATIENT PHYSICAL THERAPY TREATMENT NOTE ? ? ?Patient Name: Laura Schmitt ?MRN: [id number] ?DOB:07/25/1971, 51 y.o., female ?Today's Date: 01/16/2022 ? ?PCP: Tracie Harrier, MD ?REFERRING PROVIDER: Samara Deist, DPM ? ? PT End of Session - 01/16/22 1125   ? ? Visit Number 11   ? Number of Visits 24   ? Date for PT Re-Evaluation 04/02/22   ? Authorization Type [place] FOCUS reporting period from 01/08/2022   ? Progress Note Due on Visit 20   ? PT Start Time 1115   ? PT Stop Time 1200   ? PT Time Calculation (min) 45 min   ? Activity Tolerance Patient tolerated treatment well   ? Behavior During Therapy [facility] for tasks assessed/performed   ? ?  ?  ? ?  ? ? ? ? ?Past Medical History:  ?Diagnosis Date  ? Anemia   ? Cervical dysplasia   ? Dysplastic nevus 05/11/2008  ? RLQA - mild to moderate  ? Dysplastic nevus 05/11/2008  ? RUQA - moderate, excision 06/02/2008  ? Dysplastic nevus 05/11/2008  ? L distal pretibial - excision 06/09/2008  ? Heart murmur   ? aortic valve   ? Hemorrhoids, external, thrombosed   ? Moderate aortic insufficiency   ? ?Past Surgical History:  ?Procedure Laterality Date  ? ABDOMINAL HYSTERECTOMY    ? BILATERAL SALPINGECTOMY Bilateral 03/18/2016  ? Procedure: BILATERAL SALPINGECTOMY;  Surgeon: Boykin Nearing, MD;  Location: [facility] ORS;  Service: Gynecology;  Laterality: Bilateral;  ? COLONOSCOPY WITH PROPOFOL N/A 10/23/2017  ? Procedure: COLONOSCOPY WITH PROPOFOL;  Surgeon: Lollie Sails, MD;  Location: [facility] ENDOSCOPY;  Service: Endoscopy;  Laterality: N/A;  ? COLONOSCOPY WITH PROPOFOL N/A 02/11/2020  ? Procedure: COLONOSCOPY WITH PROPOFOL;  Surgeon: Robert Bellow, MD;  Location: [facility] ENDOSCOPY;  Service: Endoscopy;  Laterality: N/A;  ? ESOPHAGOGASTRODUODENOSCOPY (EGD) WITH PROPOFOL N/A 02/11/2020  ? Procedure: ESOPHAGOGASTRODUODENOSCOPY (EGD) WITH PROPOFOL;  Surgeon: Robert Bellow, MD;  Location: [facility] ENDOSCOPY;  Service: Endoscopy;  Laterality: N/A;  ? I&D THROMBOSED  HEMORRHOID  01/05/2008  ? LEEP  08/14/2010  ? Lt ear cyst    ? SINUS EXPLORATION    ? VAGINAL HYSTERECTOMY N/A 03/18/2016  ? Procedure: HYSTERECTOMY VAGINAL;  Surgeon: Boykin Nearing, MD;  Location: [facility] ORS;  Service: Gynecology;  Laterality: N/A;  ? ?Patient Active Problem List  ? Diagnosis Date Noted  ? Post-operative state 03/18/2016  ? Moderate aortic valve insufficiency 02/15/2016  ? ? ?REFERRING DIAG: achilles tendon pain ? ?THERAPY DIAG:  ?Pain in left ankle and joints of left foot ? ?Other symptoms and signs involving the musculoskeletal system ? ?Stiffness of left ankle, not elsewhere classified ? ?Muscle weakness (generalized) ? ?PERTINENT HISTORY: Patient is a 51 y.o. female who presents to outpatient physical therapy with a referral for medical diagnosis achilles tendon pain. This patient's chief complaints consist of left sided pain over the achilles tendon that radiates towards the knee in the setting of chronic plantar fascia pain leading to the following functional deficits: difficulty with weight bearing activities such as walking, standing, pushing, pulling, lifting, working as a Chartered certified accountant, Control and instrumentation engineer, social participation, hobbies, household and community mobility, going up hills, navigating stairs, etc. Relevant past medical history and comorbidities include bilateral plantar fasciitis, low back pain without sciatica. Patient denies hx of cancer, stroke, seizures, lung problem, major cardiac events, diabetes, unexplained weight loss, changes in bowel or bladder problems, new onset stumbling or dropping things, spinal surgery, ankle/foot surgery, sciatic pain. ? ?PRECAUTIONS:  none ? ?SUBJECTIVE: Patient reports she is feeling well with no pain currently. She has started working without her boot and has now been able to complete 3 days in a row three days in a row without taking pain medication. She states she has needed pain medication to work for at least the last 3 years. She states her left  tendon is not bothering her during work, but she is limited by plantar fasciitis symptoms in both feet. She states her HEP is going well. She has noticed a feeling of a swollen area on the right foot just anterior to heel on the plantar surface that she can feel there. She states before she spoke with Dr. Vickki Muff about that and he thought maybe she was feeling where her body is sending some fluid where it is inflamed. ? ?PAIN:  ?Are you having pain? No ? ?TODAY'S TREATMENT:  ?Therapeutic exercise: to centralize symptoms and improve ROM, strength, muscular endurance, and activity tolerance required for successful completion of functional activities ?  ?SHOES DOFFED ?- standing foot doming with MTP joint lifting, 3x25 each side. Reports feeling of something under her foot at right plantar surface just anterior to calcaneous.  ?- standing gastroc stretch with toes elevated, 3x30 seconds each side. (Interspersed between sets of other exercises as a break). ?- standing soleus stretch with toes elevated, 4x30 seconds each side. (Interspersed between sets of other exercises as a break). ?- running man, 3x10 each side (challenging on L > R), touchdown support as needed. ?- SLS balance on airex pad, 3x30 seconds each side (challenging).  ?- forward/backwards tandem walking on 5 foot aeromat, 2x5 each direction, no UE support.  ?- lateral walking on edge of 5 foot aeromat with balls of heels contacting mat, attempting to keep heels level. 3x each way. (Very fatiguing), no UE support.  ? ?Pt required multimodal cuing for proper technique and to facilitate improved neuromuscular control, strength, range of motion, and functional ability resulting in improved performance and form. ? ? ?PATIENT EDUCATION: ?Education details: Exercise purpose/form. Self management techniques. HEP, POC, progress.  ?Person educated: Patient ?Education method: Explanation, Demonstration, Tactile cues, and Verbal cues ?Education comprehension:  verbalized understanding, returned demonstration, verbal cues required, tactile cues required, and needs further education ? ? ?HOME EXERCISE PROGRAM: ?Access Code: [email or web address] ?URL: [email or web address] ?Date: 01/01/2022 ?Prepared by: Rosita Kea ? ?Exercises ?- Standing Eccentric Heel Raise  - 2-3 x daily - 1 sets - 10-15 reps - > 7 seconds lower time ?- Toe Spreading  - 1 x daily - 1 sets - 20 reps - 4 seconds hold ?- Seated Lesser Toes Extension  - 1 x daily - 1 sets - 20 reps - 4 seconds hold ?- Seated Great Toe Extension  - 1 x daily - 1 sets - 20 reps - 4 seconds hold ?- Single Leg Stance on Foam Pad  - 1 x daily - 3 sets - 30 seconds hold ?- Seated Ankle Eversion with Resistance  - 3-5 x daily - 3 sets - 20 reps ?- Seated Figure 4 Ankle Inversion with Resistance  - 3-5 x daily - 3 sets - 20 reps ?- Gastroc Stretch on Wall  - 3-5 x weekly - 3 reps - 30 seconds hold ?- Soleus Stretch on Wall  - 3-5 x weekly - 3 reps - 30 seconds hold ?- Seated Arch Lifts  - 3 x daily - 1 sets - 25 reps ?  ? ? PT Short Term Goals   ? ?  ?  PT SHORT TERM GOAL #1  ? Title Be independent with initial home exercise program for self-management of symptoms.   ? Baseline Initial HEP provided at IE (11/14/2021);   ? Time 3   ? Period Weeks   ? Status Achieved   ? Target Date 11/28/21   ? ?  ?  ? ?  ? ? ? PT Long Term Goals   ?TARGET DATE FOR ALL LONG TERM GOALS: 02/06/2022; UPDATED TARGET DATE TO 04/02/2022 FOR ALL UNMET GOALS (01/08/2022);  ?  ? PT LONG TERM GOAL #1  ? Title Be independent with a long-term home exercise program for self-management of symptoms.   ? Baseline Initial HEP provided at IE (11/14/2021); currently participating well (01/08/2022);   ? Time 12   ? Period Weeks   ? Status In progress  ?  ? PT LONG TERM GOAL #2  ? Title Demonstrate improved FOTO to equal or greater than 67 by visit #14 to demonstrate improvement in overall condition and self-reported functional ability.   ? Baseline 49 (11/14/2021); 60  (11/29/2021); 59 at visit#10 (01/08/22);   ? Time 12   ? Period Weeks   ? Status In progress  ?  ? PT LONG TERM GOAL #3  ? Title Patient will demonstrate L ankle DF and PF  ROM equal or greater than R ankle ROM with no incr

## 2022-01-22 ENCOUNTER — Ambulatory Visit: Payer: No Typology Code available for payment source | Admitting: Physical Therapy

## 2022-01-24 ENCOUNTER — Encounter: Payer: No Typology Code available for payment source | Admitting: Physical Therapy

## 2022-01-29 ENCOUNTER — Other Ambulatory Visit: Payer: Self-pay

## 2022-01-29 MED ORDER — DICYCLOMINE HCL 20 MG PO TABS
ORAL_TABLET | ORAL | 3 refills | Status: AC
  Filled 2022-01-29: qty 30, 10d supply, fill #0

## 2022-01-29 MED ORDER — COLESTIPOL HCL 1 G PO TABS
ORAL_TABLET | ORAL | 3 refills | Status: AC
  Filled 2022-01-29: qty 60, 30d supply, fill #0

## 2022-01-29 MED ORDER — POTASSIUM CHLORIDE ER 10 MEQ PO TBCR
EXTENDED_RELEASE_TABLET | ORAL | 3 refills | Status: AC
  Filled 2022-01-29: qty 90, 90d supply, fill #0

## 2022-01-30 ENCOUNTER — Other Ambulatory Visit: Payer: Self-pay

## 2022-01-30 ENCOUNTER — Ambulatory Visit: Payer: No Typology Code available for payment source | Attending: Podiatry | Admitting: Physical Therapy

## 2022-01-30 ENCOUNTER — Encounter: Payer: Self-pay | Admitting: Physical Therapy

## 2022-01-30 DIAGNOSIS — M25572 Pain in left ankle and joints of left foot: Secondary | ICD-10-CM

## 2022-01-30 DIAGNOSIS — R29898 Other symptoms and signs involving the musculoskeletal system: Secondary | ICD-10-CM | POA: Diagnosis present

## 2022-01-30 DIAGNOSIS — M25672 Stiffness of left ankle, not elsewhere classified: Secondary | ICD-10-CM | POA: Diagnosis present

## 2022-01-30 DIAGNOSIS — M6281 Muscle weakness (generalized): Secondary | ICD-10-CM

## 2022-01-30 NOTE — Therapy (Signed)
?OUTPATIENT PHYSICAL THERAPY TREATMENT NOTE ? ? ?Patient Name: Laura Schmitt ?MRN: [id number] ?DOB:05-05-1971, 51 y.o., female ?Today's Date: 01/30/2022 ? ?PCP: Tracie Harrier, MD ?REFERRING PROVIDER: Samara Deist, DPM ? ? PT End of Session - 01/30/22 1005   ? ? Visit Number 12   ? Number of Visits 24   ? Date for PT Re-Evaluation 04/02/22   ? Authorization Type [employer] reporting period from 01/08/2022   ? Progress Note Due on Visit 20   ? PT Start Time [phone]   ? PT Stop Time 1030   ? PT Time Calculation (min) 40 min   ? Activity Tolerance Patient tolerated treatment well   ? Behavior During Therapy [facility] for tasks assessed/performed   ? ?  ?  ? ?  ? ? ? ? ? ?Past Medical History:  ?Diagnosis Date  ? Anemia   ? Cervical dysplasia   ? Dysplastic nevus 05/11/2008  ? RLQA - mild to moderate  ? Dysplastic nevus 05/11/2008  ? RUQA - moderate, excision 06/02/2008  ? Dysplastic nevus 05/11/2008  ? L distal pretibial - excision 06/09/2008  ? Heart murmur   ? aortic valve   ? Hemorrhoids, external, thrombosed   ? Moderate aortic insufficiency   ? ?Past Surgical History:  ?Procedure Laterality Date  ? ABDOMINAL HYSTERECTOMY    ? BILATERAL SALPINGECTOMY Bilateral 03/18/2016  ? Procedure: BILATERAL SALPINGECTOMY;  Surgeon: Boykin Nearing, MD;  Location: [facility] ORS;  Service: Gynecology;  Laterality: Bilateral;  ? COLONOSCOPY WITH PROPOFOL N/A 10/23/2017  ? Procedure: COLONOSCOPY WITH PROPOFOL;  Surgeon: Lollie Sails, MD;  Location: [facility] ENDOSCOPY;  Service: Endoscopy;  Laterality: N/A;  ? COLONOSCOPY WITH PROPOFOL N/A 02/11/2020  ? Procedure: COLONOSCOPY WITH PROPOFOL;  Surgeon: Robert Bellow, MD;  Location: [facility] ENDOSCOPY;  Service: Endoscopy;  Laterality: N/A;  ? ESOPHAGOGASTRODUODENOSCOPY (EGD) WITH PROPOFOL N/A 02/11/2020  ? Procedure: ESOPHAGOGASTRODUODENOSCOPY (EGD) WITH PROPOFOL;  Surgeon: Robert Bellow, MD;  Location: [facility] ENDOSCOPY;  Service: Endoscopy;  Laterality: N/A;  ? I&D THROMBOSED  HEMORRHOID  01/05/2008  ? LEEP  08/14/2010  ? Lt ear cyst    ? SINUS EXPLORATION    ? VAGINAL HYSTERECTOMY N/A 03/18/2016  ? Procedure: HYSTERECTOMY VAGINAL;  Surgeon: Boykin Nearing, MD;  Location: [facility] ORS;  Service: Gynecology;  Laterality: N/A;  ? ?Patient Active Problem List  ? Diagnosis Date Noted  ? Post-operative state 03/18/2016  ? Moderate aortic valve insufficiency 02/15/2016  ? ? ?REFERRING DIAG: achilles tendon pain ? ?THERAPY DIAG:  ?Pain in left ankle and joints of left foot ? ?Other symptoms and signs involving the musculoskeletal system ? ?Stiffness of left ankle, not elsewhere classified ? ?Muscle weakness (generalized) ? ?PERTINENT HISTORY: Patient is a 51 y.o. female who presents to outpatient physical therapy with a referral for medical diagnosis achilles tendon pain. This patient's chief complaints consist of left sided pain over the achilles tendon that radiates towards the knee in the setting of chronic plantar fascia pain leading to the following functional deficits: difficulty with weight bearing activities such as walking, standing, pushing, pulling, lifting, working as a Chartered certified accountant, Control and instrumentation engineer, social participation, hobbies, household and community mobility, going up hills, navigating stairs, etc. Relevant past medical history and comorbidities include bilateral plantar fasciitis, low back pain without sciatica. Patient denies hx of cancer, stroke, seizures, lung problem, major cardiac events, diabetes, unexplained weight loss, changes in bowel or bladder problems, new onset stumbling or dropping things, spinal surgery, ankle/foot surgery, sciatic pain. ? ?  PRECAUTIONS: none ? ?SUBJECTIVE: Patient reports no pain in her feet or achilles tendon. She states her plantar fascia region gets really tired at work but she still does not have to take pain medication. When she works two nights and especially three nights back to back they get sore and tight (still doesn't need pain meds). Her low  back has been bothering her lately. She has not done a lot of exercises in the last week due to working a lot and needing to help her parents due to her mother's dementia. Back pain is a chronic problem that is just feeling a bit worse currently. If she does not wear shoes at home while doing chores she has to put her shoes back on at about 30 min due to her feet starting to hurt.  ? ?PAIN:  ?Are you having pain? 3/10 low back.  ? ?TODAY'S TREATMENT:  ?Therapeutic exercise: to centralize symptoms and improve ROM, strength, muscular endurance, and activity tolerance required for successful completion of functional activities ?  ?SHOES DOFFED ?- standing foot doming with MTP joint lifting, 3x25 each side.  ?- standing gastroc stretch with toes elevated, 3x30 seconds each side. (Interspersed between sets of other exercises as a break). ?- standing soleus stretch with toes elevated, 3x30 seconds each side. (Interspersed between sets of other exercises as a break). ?- running man, 3x10 each side (challenging on L > R), touchdown support as needed. ?- step standing pallof press with one foot on dynadisc and tried SLS anchor on left, BlueTB. Patient did not feel that it was helping her much, so discontinued.  ?- forward/backwards tandem walking on 5 foot aeromat, 1x5 each direction, no UE support.  ?- SLS balance on airex pad, 3x30 seconds each side (challenging).  ?- lateral walking on edge of 5 foot aeromat/2x4 board with balls of heels contacting mat, attempting to keep heels level. 3x each way. (Very fatiguing), no UE support.  ? ?Pt required multimodal cuing for proper technique and to facilitate improved neuromuscular control, strength, range of motion, and functional ability resulting in improved performance and form. ? ? ?PATIENT EDUCATION: ?Education details: Exercise purpose/form. Self management techniques. HEP, POC, progress.  ?Person educated: Patient ?Education method: Explanation, Demonstration, Tactile  cues, and Verbal cues ?Education comprehension: verbalized understanding, returned demonstration, verbal cues required, tactile cues required, and needs further education ? ? ?HOME EXERCISE PROGRAM: ?Access Code: [email or web address] ?URL: [email or web address] ?Date: 01/01/2022 ?Prepared by: Rosita Kea ? ?Exercises ?- Standing Eccentric Heel Raise  - 2-3 x daily - 1 sets - 10-15 reps - > 7 seconds lower time ?- Toe Spreading  - 1 x daily - 1 sets - 20 reps - 4 seconds hold ?- Seated Lesser Toes Extension  - 1 x daily - 1 sets - 20 reps - 4 seconds hold ?- Seated Great Toe Extension  - 1 x daily - 1 sets - 20 reps - 4 seconds hold ?- Single Leg Stance on Foam Pad  - 1 x daily - 3 sets - 30 seconds hold ?- Seated Ankle Eversion with Resistance  - 3-5 x daily - 3 sets - 20 reps ?- Seated Figure 4 Ankle Inversion with Resistance  - 3-5 x daily - 3 sets - 20 reps ?- Gastroc Stretch on Wall  - 3-5 x weekly - 3 reps - 30 seconds hold ?- Soleus Stretch on Wall  - 3-5 x weekly - 3 reps - 30 seconds hold ?- Seated Arch Lifts  - 3 x daily -  1 sets - 25 reps ?  ? ? PT Short Term Goals   ? ?  ? PT SHORT TERM GOAL #1  ? Title Be independent with initial home exercise program for self-management of symptoms.   ? Baseline Initial HEP provided at IE (11/14/2021);   ? Time 3   ? Period Weeks   ? Status Achieved   ? Target Date 11/28/21   ? ?  ?  ? ?  ? ? ? PT Long Term Goals   ?TARGET DATE FOR ALL LONG TERM GOALS: 02/06/2022; UPDATED TARGET DATE TO 04/02/2022 FOR ALL UNMET GOALS (01/08/2022);  ?  ? PT LONG TERM GOAL #1  ? Title Be independent with a long-term home exercise program for self-management of symptoms.   ? Baseline Initial HEP provided at IE (11/14/2021); currently participating well (01/08/2022);   ? Time 12   ? Period Weeks   ? Status In progress  ?  ? PT LONG TERM GOAL #2  ? Title Demonstrate improved FOTO to equal or greater than 67 by visit #14 to demonstrate improvement in overall condition and self-reported functional  ability.   ? Baseline 49 (11/14/2021); 60 (11/29/2021); 57 at visit#10 (01/08/22);   ? Time 12   ? Period Weeks   ? Status In progress  ?  ? PT LONG TERM GOAL #3  ? Title Patient will demonstrate L ankle DF and PF

## 2022-01-31 ENCOUNTER — Other Ambulatory Visit: Payer: Self-pay

## 2022-01-31 MED ORDER — ERGOCALCIFEROL 1.25 MG (50000 UT) PO CAPS
ORAL_CAPSULE | ORAL | 1 refills | Status: DC
  Filled 2022-01-31: qty 12, 84d supply, fill #0
  Filled 2022-07-09: qty 12, 84d supply, fill #1

## 2022-02-01 ENCOUNTER — Other Ambulatory Visit: Payer: Self-pay

## 2022-02-22 ENCOUNTER — Other Ambulatory Visit: Payer: Self-pay

## 2022-02-22 MED ORDER — MONTELUKAST SODIUM 10 MG PO TABS
ORAL_TABLET | Freq: Every day | ORAL | 1 refills | Status: DC
  Filled 2022-02-22: qty 90, 90d supply, fill #0
  Filled 2022-07-09: qty 90, 90d supply, fill #1

## 2022-03-04 ENCOUNTER — Other Ambulatory Visit (HOSPITAL_COMMUNITY): Payer: Self-pay

## 2022-07-04 ENCOUNTER — Other Ambulatory Visit: Payer: Self-pay | Admitting: Internal Medicine

## 2022-07-04 DIAGNOSIS — Z1231 Encounter for screening mammogram for malignant neoplasm of breast: Secondary | ICD-10-CM

## 2022-07-09 ENCOUNTER — Other Ambulatory Visit: Payer: Self-pay

## 2022-07-19 ENCOUNTER — Other Ambulatory Visit: Payer: Self-pay

## 2022-08-06 ENCOUNTER — Ambulatory Visit
Admission: RE | Admit: 2022-08-06 | Discharge: 2022-08-06 | Disposition: A | Discharge status: Home / Self Care | Payer: No Typology Code available for payment source | Source: Ambulatory Visit | Attending: Internal Medicine | Admitting: Internal Medicine

## 2022-08-06 DIAGNOSIS — Z1231 Encounter for screening mammogram for malignant neoplasm of breast: Secondary | ICD-10-CM | POA: Diagnosis present

## 2022-10-15 DIAGNOSIS — B9689 Other specified bacterial agents as the cause of diseases classified elsewhere: Secondary | ICD-10-CM | POA: Diagnosis not present

## 2022-10-15 DIAGNOSIS — Z23 Encounter for immunization: Secondary | ICD-10-CM | POA: Diagnosis not present

## 2022-10-15 DIAGNOSIS — J019 Acute sinusitis, unspecified: Secondary | ICD-10-CM | POA: Diagnosis not present

## 2022-11-07 ENCOUNTER — Other Ambulatory Visit: Payer: Self-pay

## 2022-11-07 MED ORDER — MONTELUKAST SODIUM 10 MG PO TABS
ORAL_TABLET | Freq: Every day | ORAL | 1 refills | Status: DC
  Filled 2022-11-07: qty 90, 90d supply, fill #0
  Filled 2023-03-31: qty 90, 90d supply, fill #1

## 2023-02-11 ENCOUNTER — Encounter: Payer: Self-pay | Admitting: Internal Medicine

## 2023-02-11 DIAGNOSIS — I351 Nonrheumatic aortic (valve) insufficiency: Secondary | ICD-10-CM | POA: Diagnosis not present

## 2023-02-11 DIAGNOSIS — J3089 Other allergic rhinitis: Secondary | ICD-10-CM | POA: Diagnosis not present

## 2023-02-11 DIAGNOSIS — E538 Deficiency of other specified B group vitamins: Secondary | ICD-10-CM | POA: Diagnosis not present

## 2023-02-11 DIAGNOSIS — E782 Mixed hyperlipidemia: Secondary | ICD-10-CM | POA: Diagnosis not present

## 2023-02-11 DIAGNOSIS — N644 Mastodynia: Secondary | ICD-10-CM | POA: Diagnosis not present

## 2023-02-11 DIAGNOSIS — L80 Vitiligo: Secondary | ICD-10-CM | POA: Diagnosis not present

## 2023-02-12 ENCOUNTER — Other Ambulatory Visit: Payer: Self-pay

## 2023-02-12 MED ORDER — FERROUS SULFATE 325 (65 FE) MG PO TABS
325.0000 mg | ORAL_TABLET | Freq: Every day | ORAL | 1 refills | Status: AC
  Filled 2023-02-12: qty 90, 90d supply, fill #0

## 2023-02-13 ENCOUNTER — Other Ambulatory Visit: Payer: Self-pay

## 2023-02-13 DIAGNOSIS — N644 Mastodynia: Secondary | ICD-10-CM

## 2023-02-13 MED ORDER — POTASSIUM CHLORIDE ER 10 MEQ PO TBCR
10.0000 meq | EXTENDED_RELEASE_TABLET | Freq: Every day | ORAL | 1 refills | Status: AC
  Filled 2023-02-13: qty 90, 90d supply, fill #0
  Filled 2023-07-02: qty 90, 90d supply, fill #1

## 2023-02-14 ENCOUNTER — Other Ambulatory Visit: Payer: Self-pay

## 2023-02-27 ENCOUNTER — Ambulatory Visit
Admission: RE | Admit: 2023-02-27 | Discharge: 2023-02-27 | Disposition: A | Discharge status: Home / Self Care | Payer: 59 | Source: Ambulatory Visit | Attending: Internal Medicine | Admitting: Internal Medicine

## 2023-02-27 DIAGNOSIS — N644 Mastodynia: Secondary | ICD-10-CM | POA: Diagnosis not present

## 2023-03-18 DIAGNOSIS — I351 Nonrheumatic aortic (valve) insufficiency: Secondary | ICD-10-CM | POA: Diagnosis not present

## 2023-05-05 ENCOUNTER — Other Ambulatory Visit: Payer: Self-pay | Admitting: Oncology

## 2023-05-05 DIAGNOSIS — Z006 Encounter for examination for normal comparison and control in clinical research program: Secondary | ICD-10-CM

## 2023-07-02 ENCOUNTER — Other Ambulatory Visit: Payer: Self-pay

## 2023-07-03 ENCOUNTER — Other Ambulatory Visit: Payer: Self-pay

## 2023-07-03 MED ORDER — ERGOCALCIFEROL 1.25 MG (50000 UT) PO CAPS
ORAL_CAPSULE | ORAL | 1 refills | Status: AC
  Filled 2023-07-03: qty 12, 84d supply, fill #0

## 2023-07-03 MED ORDER — MONTELUKAST SODIUM 10 MG PO TABS
10.0000 mg | ORAL_TABLET | Freq: Every day | ORAL | 1 refills | Status: AC
  Filled 2023-07-03: qty 90, 90d supply, fill #0

## 2023-10-21 ENCOUNTER — Other Ambulatory Visit: Payer: Self-pay | Admitting: Internal Medicine

## 2023-10-21 DIAGNOSIS — Z1231 Encounter for screening mammogram for malignant neoplasm of breast: Secondary | ICD-10-CM

## 2023-10-23 ENCOUNTER — Ambulatory Visit
Admission: RE | Admit: 2023-10-23 | Discharge: 2023-10-23 | Disposition: A | Discharge status: Home / Self Care | Payer: BC Managed Care – PPO | Source: Ambulatory Visit | Attending: Internal Medicine | Admitting: Internal Medicine

## 2023-10-23 DIAGNOSIS — Z1231 Encounter for screening mammogram for malignant neoplasm of breast: Secondary | ICD-10-CM | POA: Insufficient documentation

## 2023-10-27 ENCOUNTER — Other Ambulatory Visit: Payer: Self-pay | Admitting: Internal Medicine

## 2023-10-27 DIAGNOSIS — R928 Other abnormal and inconclusive findings on diagnostic imaging of breast: Secondary | ICD-10-CM

## 2023-10-28 ENCOUNTER — Ambulatory Visit
Admission: RE | Admit: 2023-10-28 | Discharge: 2023-10-28 | Disposition: A | Discharge status: Home / Self Care | Payer: BC Managed Care – PPO | Source: Ambulatory Visit | Attending: Internal Medicine | Admitting: Internal Medicine

## 2023-10-28 DIAGNOSIS — R928 Other abnormal and inconclusive findings on diagnostic imaging of breast: Secondary | ICD-10-CM

## 2023-11-04 ENCOUNTER — Other Ambulatory Visit: Payer: Self-pay

## 2023-11-04 MED ORDER — OSELTAMIVIR PHOSPHATE 75 MG PO CAPS
75.0000 mg | ORAL_CAPSULE | Freq: Two times a day (BID) | ORAL | 0 refills | Status: AC
  Filled 2023-11-04: qty 10, 5d supply, fill #0

## 2023-11-14 ENCOUNTER — Other Ambulatory Visit: Payer: Self-pay

## 2024-07-21 ENCOUNTER — Other Ambulatory Visit: Payer: Self-pay | Admitting: Medical Genetics

## 2024-07-21 DIAGNOSIS — Z006 Encounter for examination for normal comparison and control in clinical research program: Secondary | ICD-10-CM

## 2024-09-28 ENCOUNTER — Other Ambulatory Visit: Payer: Self-pay

## 2024-09-28 MED ORDER — DOXYCYCLINE HYCLATE 100 MG PO CAPS
100.0000 mg | ORAL_CAPSULE | Freq: Two times a day (BID) | ORAL | 0 refills | Status: AC
  Filled 2024-09-28: qty 20, 10d supply, fill #0

## 2024-09-28 MED ORDER — TRAZODONE HCL 50 MG PO TABS
50.0000 mg | ORAL_TABLET | Freq: Every day | ORAL | 3 refills | Status: AC
  Filled 2024-09-28: qty 30, 30d supply, fill #0

## 2024-10-04 ENCOUNTER — Ambulatory Visit
